# Patient Record
Sex: Female | Born: 1935 | Race: White | Hispanic: No | Smoking: Never smoker
Health system: Southern US, Community
[De-identification: ages and names within clinical notes are randomized; demographics above are authoritative.]

## PROBLEM LIST (undated history)

## (undated) DIAGNOSIS — M1990 Unspecified osteoarthritis, unspecified site: Secondary | ICD-10-CM

## (undated) DIAGNOSIS — E785 Hyperlipidemia, unspecified: Secondary | ICD-10-CM

## (undated) DIAGNOSIS — E039 Hypothyroidism, unspecified: Secondary | ICD-10-CM

## (undated) DIAGNOSIS — I739 Peripheral vascular disease, unspecified: Secondary | ICD-10-CM

## (undated) DIAGNOSIS — M810 Age-related osteoporosis without current pathological fracture: Secondary | ICD-10-CM

## (undated) DIAGNOSIS — M353 Polymyalgia rheumatica: Secondary | ICD-10-CM

## (undated) DIAGNOSIS — I10 Essential (primary) hypertension: Secondary | ICD-10-CM

## (undated) DIAGNOSIS — I701 Atherosclerosis of renal artery: Secondary | ICD-10-CM

## (undated) HISTORY — PX: JOINT REPLACEMENT: SHX530

## (undated) HISTORY — PX: TONSILLECTOMY: SUR1361

## (undated) HISTORY — PX: ELBOW SURGERY: SHX618

## (undated) HISTORY — PX: BREAST BIOPSY: SHX20

## (undated) HISTORY — PX: TEMPORAL ARTERY BIOPSY / LIGATION: SUR132

---

## 2010-06-24 ENCOUNTER — Ambulatory Visit: Payer: Self-pay

## 2010-09-15 ENCOUNTER — Ambulatory Visit: Payer: Self-pay | Admitting: Unknown Physician Specialty

## 2010-09-21 DIAGNOSIS — N289 Disorder of kidney and ureter, unspecified: Secondary | ICD-10-CM

## 2010-09-22 ENCOUNTER — Inpatient Hospital Stay: Payer: Self-pay | Admitting: Unknown Physician Specialty

## 2010-10-27 ENCOUNTER — Emergency Department: Payer: Self-pay | Admitting: Emergency Medicine

## 2010-11-16 ENCOUNTER — Ambulatory Visit: Payer: Self-pay | Admitting: Internal Medicine

## 2010-12-16 ENCOUNTER — Ambulatory Visit: Payer: Self-pay | Admitting: Internal Medicine

## 2011-07-05 ENCOUNTER — Ambulatory Visit: Payer: Self-pay | Admitting: Ophthalmology

## 2012-02-03 ENCOUNTER — Ambulatory Visit: Payer: Self-pay | Admitting: Physical Medicine and Rehabilitation

## 2012-06-19 ENCOUNTER — Encounter: Payer: Self-pay | Admitting: Cardiothoracic Surgery

## 2012-06-19 ENCOUNTER — Encounter: Payer: Self-pay | Admitting: Nurse Practitioner

## 2012-07-15 ENCOUNTER — Encounter: Payer: Self-pay | Admitting: Nurse Practitioner

## 2012-07-15 ENCOUNTER — Encounter: Payer: Self-pay | Admitting: Cardiothoracic Surgery

## 2012-08-15 ENCOUNTER — Encounter: Payer: Self-pay | Admitting: Nurse Practitioner

## 2012-08-15 ENCOUNTER — Encounter: Payer: Self-pay | Admitting: Cardiothoracic Surgery

## 2013-02-16 ENCOUNTER — Emergency Department: Payer: Self-pay | Admitting: Emergency Medicine

## 2013-02-17 ENCOUNTER — Emergency Department: Payer: Self-pay | Admitting: Emergency Medicine

## 2013-02-17 LAB — COMPREHENSIVE METABOLIC PANEL
Albumin: 3.3 g/dL — ABNORMAL LOW (ref 3.4–5.0)
Alkaline Phosphatase: 63 U/L (ref 50–136)
Bilirubin,Total: 0.7 mg/dL (ref 0.2–1.0)
Chloride: 106 mmol/L (ref 98–107)
Co2: 26 mmol/L (ref 21–32)
Creatinine: 1.42 mg/dL — ABNORMAL HIGH (ref 0.60–1.30)
EGFR (African American): 38 — ABNORMAL LOW
EGFR (Non-African Amer.): 33 — ABNORMAL LOW
Glucose: 79 mg/dL (ref 65–99)
Potassium: 3.9 mmol/L (ref 3.5–5.1)
SGOT(AST): 34 U/L (ref 15–37)
SGPT (ALT): 24 U/L (ref 12–78)
Sodium: 141 mmol/L (ref 136–145)
Total Protein: 6.7 g/dL (ref 6.4–8.2)

## 2013-02-17 LAB — URINALYSIS, COMPLETE
Bacteria: NONE SEEN
Bilirubin,UR: NEGATIVE
Leukocyte Esterase: NEGATIVE
Ph: 9 (ref 4.5–8.0)
Protein: NEGATIVE
RBC,UR: 1 /HPF (ref 0–5)
Squamous Epithelial: NONE SEEN
WBC UR: NONE SEEN /HPF (ref 0–5)

## 2013-02-17 LAB — CBC
HGB: 13 g/dL (ref 12.0–16.0)
MCH: 30.4 pg (ref 26.0–34.0)
MCV: 89 fL (ref 80–100)
RDW: 13.1 % (ref 11.5–14.5)
WBC: 14.3 10*3/uL — ABNORMAL HIGH (ref 3.6–11.0)

## 2013-02-19 ENCOUNTER — Ambulatory Visit: Payer: Self-pay | Admitting: Physical Medicine and Rehabilitation

## 2013-03-19 ENCOUNTER — Ambulatory Visit: Payer: Self-pay | Admitting: Rheumatology

## 2013-04-10 ENCOUNTER — Ambulatory Visit: Payer: Self-pay | Admitting: Pain Medicine

## 2013-04-30 ENCOUNTER — Emergency Department: Payer: Self-pay | Admitting: Emergency Medicine

## 2013-04-30 LAB — URINALYSIS, COMPLETE
Bacteria: NONE SEEN
Blood: NEGATIVE
Glucose,UR: NEGATIVE mg/dL (ref 0–75)
Ketone: NEGATIVE
Leukocyte Esterase: NEGATIVE
Nitrite: NEGATIVE
Ph: 6 (ref 4.5–8.0)
RBC,UR: NONE SEEN /HPF (ref 0–5)
Squamous Epithelial: <1
WBC UR: 1 /HPF (ref 0–5)

## 2013-04-30 LAB — CBC
HCT: 37.5 % (ref 35.0–47.0)
HGB: 11.8 g/dL — ABNORMAL LOW (ref 12.0–16.0)
MCH: 27.3 pg (ref 26.0–34.0)
MCHC: 31.4 g/dL — ABNORMAL LOW (ref 32.0–36.0)
MCV: 87 fL (ref 80–100)
Platelet: 375 10*3/uL (ref 150–440)
RBC: 4.31 10*6/uL (ref 3.80–5.20)
RDW: 13.5 % (ref 11.5–14.5)
WBC: 15.2 10*3/uL — ABNORMAL HIGH (ref 3.6–11.0)

## 2013-04-30 LAB — BASIC METABOLIC PANEL
Anion Gap: 3 — ABNORMAL LOW (ref 7–16)
BUN: 15 mg/dL (ref 7–18)
Chloride: 102 mmol/L (ref 98–107)
Co2: 30 mmol/L (ref 21–32)
EGFR (African American): 51 — ABNORMAL LOW
EGFR (Non-African Amer.): 44 — ABNORMAL LOW
Sodium: 135 mmol/L — ABNORMAL LOW (ref 136–145)

## 2013-06-28 ENCOUNTER — Ambulatory Visit: Payer: Self-pay | Admitting: Pain Medicine

## 2014-05-27 ENCOUNTER — Emergency Department: Payer: Self-pay | Admitting: Emergency Medicine

## 2014-05-27 LAB — URINALYSIS, COMPLETE
BLOOD: NEGATIVE
Bilirubin,UR: NEGATIVE
Glucose,UR: NEGATIVE mg/dL (ref 0–75)
Hyaline Cast: 2
Ketone: NEGATIVE
Leukocyte Esterase: NEGATIVE
NITRITE: NEGATIVE
PROTEIN: NEGATIVE
Ph: 5 (ref 4.5–8.0)
RBC,UR: 1 /HPF (ref 0–5)
Specific Gravity: 1.013 (ref 1.003–1.030)
Squamous Epithelial: <1

## 2014-05-27 LAB — BASIC METABOLIC PANEL
Anion Gap: 8 (ref 7–16)
BUN: 24 mg/dL — ABNORMAL HIGH (ref 7–18)
CO2: 24 mmol/L (ref 21–32)
CREATININE: 1.2 mg/dL (ref 0.60–1.30)
Calcium, Total: 8.9 mg/dL (ref 8.5–10.1)
Chloride: 106 mmol/L (ref 98–107)
GFR CALC AF AMER: 54 — AB
GFR CALC NON AF AMER: 45 — AB
Glucose: 136 mg/dL — ABNORMAL HIGH (ref 65–99)
Osmolality: 282 (ref 275–301)
Potassium: 4.1 mmol/L (ref 3.5–5.1)
Sodium: 138 mmol/L (ref 136–145)

## 2014-05-27 LAB — CBC WITH DIFFERENTIAL/PLATELET
BASOS ABS: 0.1 10*3/uL (ref 0.0–0.1)
Basophil %: 1.2 %
Eosinophil #: 0 10*3/uL (ref 0.0–0.7)
Eosinophil %: 0.3 %
HCT: 28.8 % — ABNORMAL LOW (ref 35.0–47.0)
HGB: 8.8 g/dL — ABNORMAL LOW (ref 12.0–16.0)
LYMPHS ABS: 0.9 10*3/uL — AB (ref 1.0–3.6)
Lymphocyte %: 10.1 %
MCH: 25.5 pg — AB (ref 26.0–34.0)
MCHC: 30.5 g/dL — ABNORMAL LOW (ref 32.0–36.0)
MCV: 84 fL (ref 80–100)
MONO ABS: 0.6 x10 3/mm (ref 0.2–0.9)
Monocyte %: 7.4 %
NEUTROS PCT: 81 %
Neutrophil #: 7.1 10*3/uL — ABNORMAL HIGH (ref 1.4–6.5)
PLATELETS: 489 10*3/uL — AB (ref 150–440)
RBC: 3.44 10*6/uL — ABNORMAL LOW (ref 3.80–5.20)
RDW: 17.6 % — ABNORMAL HIGH (ref 11.5–14.5)
WBC: 8.7 10*3/uL (ref 3.6–11.0)

## 2014-05-27 LAB — TROPONIN I: Troponin-I: <0.02 ng/mL

## 2015-01-19 ENCOUNTER — Encounter: Payer: Self-pay | Admitting: Adult Health

## 2015-01-19 ENCOUNTER — Inpatient Hospital Stay
Admission: EM | Admit: 2015-01-19 | Discharge: 2015-01-22 | DRG: 392 | Disposition: A | Discharge status: Home / Self Care | Payer: Medicare Other | Attending: Internal Medicine | Admitting: Internal Medicine

## 2015-01-19 ENCOUNTER — Emergency Department: Payer: Medicare Other

## 2015-01-19 DIAGNOSIS — I739 Peripheral vascular disease, unspecified: Secondary | ICD-10-CM | POA: Diagnosis present

## 2015-01-19 DIAGNOSIS — M810 Age-related osteoporosis without current pathological fracture: Secondary | ICD-10-CM | POA: Diagnosis present

## 2015-01-19 DIAGNOSIS — K862 Cyst of pancreas: Secondary | ICD-10-CM | POA: Diagnosis present

## 2015-01-19 DIAGNOSIS — R1031 Right lower quadrant pain: Secondary | ICD-10-CM

## 2015-01-19 DIAGNOSIS — M353 Polymyalgia rheumatica: Secondary | ICD-10-CM | POA: Diagnosis not present

## 2015-01-19 DIAGNOSIS — E039 Hypothyroidism, unspecified: Secondary | ICD-10-CM | POA: Diagnosis present

## 2015-01-19 DIAGNOSIS — E860 Dehydration: Secondary | ICD-10-CM | POA: Diagnosis not present

## 2015-01-19 DIAGNOSIS — Z7983 Long term (current) use of bisphosphonates: Secondary | ICD-10-CM

## 2015-01-19 DIAGNOSIS — E785 Hyperlipidemia, unspecified: Secondary | ICD-10-CM | POA: Diagnosis not present

## 2015-01-19 DIAGNOSIS — Z7951 Long term (current) use of inhaled steroids: Secondary | ICD-10-CM

## 2015-01-19 DIAGNOSIS — K5732 Diverticulitis of large intestine without perforation or abscess without bleeding: Principal | ICD-10-CM | POA: Diagnosis present

## 2015-01-19 DIAGNOSIS — Z823 Family history of stroke: Secondary | ICD-10-CM | POA: Diagnosis not present

## 2015-01-19 DIAGNOSIS — M1990 Unspecified osteoarthritis, unspecified site: Secondary | ICD-10-CM | POA: Diagnosis present

## 2015-01-19 DIAGNOSIS — I10 Essential (primary) hypertension: Secondary | ICD-10-CM | POA: Diagnosis present

## 2015-01-19 DIAGNOSIS — Z825 Family history of asthma and other chronic lower respiratory diseases: Secondary | ICD-10-CM

## 2015-01-19 DIAGNOSIS — Z8249 Family history of ischemic heart disease and other diseases of the circulatory system: Secondary | ICD-10-CM | POA: Diagnosis not present

## 2015-01-19 DIAGNOSIS — I701 Atherosclerosis of renal artery: Secondary | ICD-10-CM | POA: Diagnosis present

## 2015-01-19 DIAGNOSIS — Z79899 Other long term (current) drug therapy: Secondary | ICD-10-CM | POA: Diagnosis not present

## 2015-01-19 HISTORY — DX: Peripheral vascular disease, unspecified: I73.9

## 2015-01-19 HISTORY — DX: Unspecified osteoarthritis, unspecified site: M19.90

## 2015-01-19 HISTORY — DX: Hypothyroidism, unspecified: E03.9

## 2015-01-19 HISTORY — DX: Age-related osteoporosis without current pathological fracture: M81.0

## 2015-01-19 HISTORY — DX: Essential (primary) hypertension: I10

## 2015-01-19 HISTORY — DX: Polymyalgia rheumatica: M35.3

## 2015-01-19 HISTORY — DX: Hyperlipidemia, unspecified: E78.5

## 2015-01-19 HISTORY — DX: Atherosclerosis of renal artery: I70.1

## 2015-01-19 LAB — COMPREHENSIVE METABOLIC PANEL
ALBUMIN: 3.9 g/dL (ref 3.5–5.0)
ALK PHOS: 43 U/L (ref 38–126)
ALT: 14 U/L (ref 14–54)
AST: 33 U/L (ref 15–41)
Anion gap: 11 (ref 5–15)
BILIRUBIN TOTAL: 0.8 mg/dL (ref 0.3–1.2)
BUN: 29 mg/dL — AB (ref 6–20)
CO2: 22 mmol/L (ref 22–32)
Calcium: 9.1 mg/dL (ref 8.9–10.3)
Chloride: 101 mmol/L (ref 101–111)
Creatinine, Ser: 1.04 mg/dL — ABNORMAL HIGH (ref 0.44–1.00)
GFR calc Af Amer: 53 mL/min — ABNORMAL LOW (ref >60)
GFR calc non Af Amer: 46 mL/min — ABNORMAL LOW (ref >60)
GLUCOSE: 104 mg/dL — AB (ref 65–99)
POTASSIUM: 4.6 mmol/L (ref 3.5–5.1)
Sodium: 134 mmol/L — ABNORMAL LOW (ref 135–145)
TOTAL PROTEIN: 6.6 g/dL (ref 6.5–8.1)

## 2015-01-19 LAB — CBC
HEMATOCRIT: 31 % — AB (ref 35.0–47.0)
Hemoglobin: 9.8 g/dL — ABNORMAL LOW (ref 12.0–16.0)
MCH: 26.8 pg (ref 26.0–34.0)
MCHC: 31.4 g/dL — AB (ref 32.0–36.0)
MCV: 85.4 fL (ref 80.0–100.0)
Platelets: 444 10*3/uL — ABNORMAL HIGH (ref 150–440)
RBC: 3.63 MIL/uL — ABNORMAL LOW (ref 3.80–5.20)
RDW: 19.7 % — AB (ref 11.5–14.5)
WBC: 21.5 10*3/uL — ABNORMAL HIGH (ref 3.6–11.0)

## 2015-01-19 LAB — URINALYSIS COMPLETE WITH MICROSCOPIC (ARMC ONLY)
BACTERIA UA: NONE SEEN
BILIRUBIN URINE: NEGATIVE
GLUCOSE, UA: NEGATIVE mg/dL
HGB URINE DIPSTICK: NEGATIVE
Leukocytes, UA: NEGATIVE
NITRITE: NEGATIVE
Protein, ur: 30 mg/dL — AB
SQUAMOUS EPITHELIAL / LPF: NONE SEEN
Specific Gravity, Urine: 1.011 (ref 1.005–1.030)
pH: 7 (ref 5.0–8.0)

## 2015-01-19 LAB — TROPONIN I: Troponin I: <0.03 ng/mL (ref <0.031)

## 2015-01-19 LAB — LIPASE, BLOOD: LIPASE: 42 U/L (ref 22–51)

## 2015-01-19 MED ORDER — PIPERACILLIN-TAZOBACTAM 3.375 G IVPB
3.3750 g | Freq: Once | INTRAVENOUS | Status: AC
  Administered 2015-01-19: 3.375 g via INTRAVENOUS
  Filled 2015-01-19: qty 50

## 2015-01-19 MED ORDER — IOHEXOL 300 MG/ML  SOLN
100.0000 mL | Freq: Once | INTRAMUSCULAR | Status: AC | PRN
  Administered 2015-01-19: 80 mL via INTRAVENOUS

## 2015-01-19 MED ORDER — IOHEXOL 240 MG/ML SOLN: 25.0000 mL | INTRAMUSCULAR | Status: AC

## 2015-01-19 NOTE — ED Notes (Signed)
Presents from home-lives alone-c/o nausea, abdominal pain and nausea and right flank pain-endorses constipation, last BM is unknown. Endorses frequent urination. Given 4 mg zofran by EMS with relief of nausea-pt has tenderness to right flank and side with touch-skin very warm to touch-oral temp 98.3.

## 2015-01-19 NOTE — Consult Note (Signed)
Kristen Church is a 81 y.o. female  seen in the emergency room with abdominal pain.  HPI: She presents emergency room with approximately 10 hours of abdominal pain. She's felt "bad" all day and began to develop some nausea this afternoon. She complained of lower quadrant abdominal pain primarily on the right side. She felt that if she would vomit her symptoms would improve but she could not make herself vomit. She denies any fever. She is a bit confused for pain medicine so keeping her on track is difficult.  Looking at her hospital chart she does not have any previous similar problems. She was diagnosed in 2012 with ischemic colitis and mesenteric ischemia primarily on her ultrasound and colonoscopy. She's not had a colonoscopy since that time. She does not have any significant bowel problems other than mild constipation but again I'm not certain the patient's history is accurate.  Workup in the emergency room revealed an elevated white blood cell count at 21,000. 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 She does have history of chronic abdominal pain and chronic pain syndrome. She also has some thickened colon some mesenteric edema consistent with possible colitis possible diverticulitis. Does not appear to be any free air or free fluid. Surgical service was consulted.  Past Medical History  Diagnosis Date  . Hypertension    History reviewed. No pertinent past surgical history. Social History   Social History  . Marital Status: Widowed    Spouse Name: N/A  . Number of Children: N/A  . Years of Education: N/A   Social History Main Topics  . Smoking status: Never Smoker   . Smokeless tobacco: None  . Alcohol Use: No  . Drug Use: No  . Sexual Activity: Not Asked   Other Topics Concern  . None   Social History Narrative  . None    Review of Systems: Review of Systems  Unable to perform ROS: acuity of condition   patient is moderately confused but  has been medicated.  PHYSICAL EXAM: BP 164/81 mmHg  Pulse 89  Temp(Src) 99.9 F (37.7 C) (Rectal)  Resp 22  Ht 4\' 9"  (1.448 m)  Wt 97 lb 8 oz (44.226 kg)  BMI 21.09 kg/m2  SpO2 99%  LMP  (Approximate)  Physical Exam  Constitutional:  She appears cachectic and quite undernourished.  HENT:  Head: Normocephalic and atraumatic.  Eyes: EOM are normal. Pupils are equal, round, and reactive to light.  Neck: Normal range of motion. Neck supple.  Cardiovascular: Regular rhythm, normal heart sounds and intact distal pulses.   No murmur heard. Pulmonary/Chest: Effort normal and breath sounds normal. No respiratory distress. She has no wheezes.  Abdominal: Soft. Bowel sounds are normal. There is tenderness. There is guarding.  Musculoskeletal: Normal range of motion. She exhibits edema.  Neurological:  She is not completely oriented. She does not know what day it is. She does not she is at the hospital. She is arousable but lethargic.  Skin: Skin is warm and dry.  Her skin is quite thin with easy bruisability.  Psychiatric:  Unable to determine.   Her abdomen is soft not distended with some generalized right lower quadrant suprapubic tenderness. She does have active bowel sounds. She has some guarding but no rebound.  Impression/Plan: I independently reviewed her CT scan. This very difficult study with her hip replacements which cloud the pelvis with artifact. She does have some colonic swelling and by report some mesenteric edema.  No  free air or free fluid. There is no obvious surgical indication. With her history of mesenteric ischemia aspect she may have the same problem although at this point she may have some colonic inertia providing colonic distention and ischemia. We will continue to follow her while hospitalized. She is a very poor surgical candidate. No family is available to discuss aggressiveness of care with at the present time.   Dia Crawford III, MD  01/19/2015, 11:08 PM

## 2015-01-19 NOTE — ED Provider Notes (Signed)
[facility] Provider Note  ____________________________________________  Time seen: Approximately 8:28 PM  I have reviewed the triage vital signs and the nursing notes.   HISTORY  Chief Complaint Nausea and Abdominal Pain   HPI Kristen Church is a 81 y.o. female who reports she had a small stool this today in her apartment and then she got nauseated and was walking around trying to throw up but couldn't. Since then she has developed right lower quadrant abdominal pain and tenderness. He does not have any fever is still somewhat nauseated nothing seems to make the pain better or worse pain did seem to move from generalized to her Right Lower Quadrant.   Past Medical History  Diagnosis Date  . Hypertension     There are no active problems to display for this patient.   History reviewed. No pertinent past surgical history.  No current outpatient prescriptions on file.  Allergies Sulfa antibiotics  History reviewed. No pertinent family history.  Social History Social History  Substance Use Topics  . Smoking status: Never Smoker   . Smokeless tobacco: None  . Alcohol Use: No    Review of Systems Constitutional: No fever/chills Eyes: No visual changes. ENT: No sore throat. Cardiovascular: Denies chest pain. Respiratory: Denies shortness of breath. Gastrointestinal: See history of present illness Genitourinary: Negative for dysuria. Musculoskeletal: Negative for back pain. Skin: Negative for rash. Neurological: Negative for headaches, focal weakness or numbness.  10-point ROS otherwise negative.  ____________________________________________   PHYSICAL EXAM:  VITAL SIGNS: ED Triage Vitals  Enc Vitals Group     BP 01/19/15 1855 192/83 mmHg     Pulse Rate 01/19/15 1855 81     Resp 01/19/15 1855 20     Temp 01/19/15 1855 98.3 F (36.8 C)     Temp Source 01/19/15 1855 Oral     SpO2 01/19/15 1855 100 %     Weight  01/19/15 1855 97 lb 8 oz (44.226 kg)     Height 01/19/15 1855 4\' 9"  (1.448 m)     Head Cir --      Peak Flow --      Pain Score 01/19/15 1856 10     Pain Loc --      Pain Edu? --      Excl. in [place]? --     Constitutional: Alert and oriented. Well appearing and in no acute distress. Eyes: Conjunctivae are normal. PERRL. EOMI. Head: Atraumatic. Nose: No congestion/rhinnorhea. Mouth/Throat: Mucous membranes are moist.  Oropharynx non-erythematous. Neck: No stridor. Cardiovascular: Normal rate, regular rhythm. Grossly normal heart sounds.  Good peripheral circulation. Respiratory: Normal respiratory effort.  No retractions. Lungs CTAB. Gastrointestinal: Soft and nontender except for right lower quadrant and right lower quadrant is tender to palpation and percussion No distention. No abdominal bruits. No CVA tenderness. Musculoskeletal: No lower extremity tenderness nor edema.  No joint effusions. Neurologic:  Normal speech and language. No gross focal neurologic deficits are appreciated. No gait instability. Skin:  Skin is warm, dry and intact. No rash noted. Psychiatric: Mood and affect are normal. Speech and behavior are normal.  ____________________________________________   LABS (all labs ordered are listed, but only abnormal results are displayed)  Labs Reviewed  COMPREHENSIVE METABOLIC PANEL - Abnormal; Notable for the following:    Sodium 134 (*)    Glucose, Bld 104 (*)    BUN 29 (*)    Creatinine, Ser 1.04 (*)    GFR calc non Af Amer 46 (*)  GFR calc Af Amer 53 (*)    All other components within normal limits  CBC - Abnormal; Notable for the following:    WBC 21.5 (*)    RBC 3.63 (*)    Hemoglobin 9.8 (*)    HCT 31.0 (*)    MCHC 31.4 (*)    RDW 19.7 (*)    Platelets 444 (*)    All other components within normal limits  URINALYSIS COMPLETEWITH MICROSCOPIC ([facility] ONLY) - Abnormal; Notable for the following:    Color, Urine YELLOW (*)    APPearance CLEAR (*)     Ketones, ur 1+ (*)    Protein, ur 30 (*)    All other components within normal limits  LIPASE, BLOOD  TROPONIN I   ____________________________________________  EKG  EKG read and interpreted by me shows normal sinus rhythm rate of 91 left axis no acute changes ____________________________________________  RADIOLOGY  Radiologist reads the CT scan as possible diverticulitis and the abnormalities in the pancreas. ____________________________________________   PROCEDURES   ____________________________________________   INITIAL IMPRESSION / ASSESSMENT AND PLAN / ED COURSE  Pertinent labs & imaging results that were available during my care of the patient were reviewed by me and considered in my medical decision making (see chart for details). Patient continues to have right lower quadrant pain a very high white blood count. I discussed the patient with Dr. Pat Patrick and the medical hospitalist as well. Dr. Pat Patrick will consult on the patient and the have ordered some antibiotics for the patient medical hospitalist will admit the patient since he is back down from seen somebody on the floor.  ____________________________________________   FINAL CLINICAL IMPRESSION(S) / ED DIAGNOSES  Final diagnoses:  Right lower quadrant abdominal pain      Nena Polio, MD 01/19/15 2234

## 2015-01-20 DIAGNOSIS — E860 Dehydration: Secondary | ICD-10-CM | POA: Diagnosis present

## 2015-01-20 DIAGNOSIS — K5732 Diverticulitis of large intestine without perforation or abscess without bleeding: Principal | ICD-10-CM

## 2015-01-20 LAB — BASIC METABOLIC PANEL
ANION GAP: 7 (ref 5–15)
BUN: 24 mg/dL — ABNORMAL HIGH (ref 6–20)
CO2: 25 mmol/L (ref 22–32)
Calcium: 8 mg/dL — ABNORMAL LOW (ref 8.9–10.3)
Chloride: 102 mmol/L (ref 101–111)
Creatinine, Ser: 1.14 mg/dL — ABNORMAL HIGH (ref 0.44–1.00)
GFR calc Af Amer: 48 mL/min — ABNORMAL LOW (ref >60)
GFR, EST NON AFRICAN AMERICAN: 41 mL/min — AB (ref >60)
Glucose, Bld: 90 mg/dL (ref 65–99)
POTASSIUM: 3.7 mmol/L (ref 3.5–5.1)
SODIUM: 134 mmol/L — AB (ref 135–145)

## 2015-01-20 LAB — CBC
HEMATOCRIT: 26.2 % — AB (ref 35.0–47.0)
HEMOGLOBIN: 8.5 g/dL — AB (ref 12.0–16.0)
MCH: 27.9 pg (ref 26.0–34.0)
MCHC: 32.7 g/dL (ref 32.0–36.0)
MCV: 85.5 fL (ref 80.0–100.0)
Platelets: 355 10*3/uL (ref 150–440)
RBC: 3.06 MIL/uL — ABNORMAL LOW (ref 3.80–5.20)
RDW: 20 % — ABNORMAL HIGH (ref 11.5–14.5)
WBC: 23.6 10*3/uL — AB (ref 3.6–11.0)

## 2015-01-20 MED ORDER — PIPERACILLIN-TAZOBACTAM 3.375 G IVPB
3.3750 g | Freq: Three times a day (TID) | INTRAVENOUS | Status: DC
  Administered 2015-01-20 – 2015-01-22 (×7): 3.375 g via INTRAVENOUS
  Filled 2015-01-20 (×11): qty 50

## 2015-01-20 MED ORDER — ONDANSETRON HCL 4 MG PO TABS: 4.0000 mg | ORAL_TABLET | Freq: Four times a day (QID) | ORAL | Status: DC | PRN

## 2015-01-20 MED ORDER — TRAMADOL HCL 50 MG PO TABS: 100.0000 mg | ORAL_TABLET | Freq: Two times a day (BID) | ORAL | Status: DC | PRN

## 2015-01-20 MED ORDER — AMLODIPINE BESY-BENAZEPRIL HCL 5-20 MG PO CAPS: 1.0000 | ORAL_CAPSULE | Freq: Every day | ORAL | Status: DC

## 2015-01-20 MED ORDER — METHOTREXATE 2.5 MG PO TABS: 15.0000 mg | ORAL_TABLET | ORAL | Status: DC

## 2015-01-20 MED ORDER — ENOXAPARIN SODIUM 30 MG/0.3ML ~~LOC~~ SOLN
30.0000 mg | SUBCUTANEOUS | Status: DC
  Administered 2015-01-20 – 2015-01-22 (×3): 30 mg via SUBCUTANEOUS
  Filled 2015-01-20 (×3): qty 0.3

## 2015-01-20 MED ORDER — BOOST / RESOURCE BREEZE PO LIQD
1.0000 | Freq: Three times a day (TID) | ORAL | Status: DC
  Administered 2015-01-20 – 2015-01-22 (×6): 1 via ORAL

## 2015-01-20 MED ORDER — ACETAMINOPHEN 650 MG RE SUPP: 650.0000 mg | Freq: Four times a day (QID) | RECTAL | Status: DC | PRN

## 2015-01-20 MED ORDER — FOLIC ACID 1 MG PO TABS
1.0000 mg | ORAL_TABLET | Freq: Every day | ORAL | Status: DC
  Administered 2015-01-20 – 2015-01-22 (×3): 1 mg via ORAL
  Filled 2015-01-20 (×3): qty 1

## 2015-01-20 MED ORDER — BENAZEPRIL HCL 20 MG PO TABS
20.0000 mg | ORAL_TABLET | Freq: Every day | ORAL | Status: DC
  Administered 2015-01-20 – 2015-01-22 (×3): 20 mg via ORAL
  Filled 2015-01-20 (×3): qty 1

## 2015-01-20 MED ORDER — LEVOTHYROXINE SODIUM 75 MCG PO TABS
75.0000 ug | ORAL_TABLET | Freq: Every day | ORAL | Status: DC
Start: 2015-01-20 — End: 2015-01-22
  Administered 2015-01-20 – 2015-01-22 (×3): 75 ug via ORAL
  Filled 2015-01-20 (×3): qty 1

## 2015-01-20 MED ORDER — SODIUM CHLORIDE 0.9 % IV SOLN
INTRAVENOUS | Status: AC
  Administered 2015-01-20: 13:00:00 via INTRAVENOUS

## 2015-01-20 MED ORDER — FAMOTIDINE 20 MG PO TABS
20.0000 mg | ORAL_TABLET | Freq: Every day | ORAL | Status: DC
  Administered 2015-01-20 – 2015-01-22 (×3): 20 mg via ORAL
  Filled 2015-01-20 (×3): qty 1

## 2015-01-20 MED ORDER — ONDANSETRON HCL 4 MG/2ML IJ SOLN: 4.0000 mg | Freq: Four times a day (QID) | INTRAMUSCULAR | Status: DC | PRN

## 2015-01-20 MED ORDER — HYDROXYCHLOROQUINE SULFATE 200 MG PO TABS
200.0000 mg | ORAL_TABLET | Freq: Every day | ORAL | Status: DC
  Administered 2015-01-21 – 2015-01-22 (×2): 200 mg via ORAL
  Filled 2015-01-20 (×2): qty 1

## 2015-01-20 MED ORDER — ACETAMINOPHEN 325 MG PO TABS: 650.0000 mg | ORAL_TABLET | Freq: Four times a day (QID) | ORAL | Status: DC | PRN

## 2015-01-20 MED ORDER — INFLUENZA VAC SPLIT QUAD 0.5 ML IM SUSY
0.5000 mL | PREFILLED_SYRINGE | INTRAMUSCULAR | Status: AC | PRN
  Administered 2015-01-22: 0.5 mL via INTRAMUSCULAR
  Filled 2015-01-20: qty 0.5

## 2015-01-20 MED ORDER — AMLODIPINE BESYLATE 5 MG PO TABS
5.0000 mg | ORAL_TABLET | Freq: Every day | ORAL | Status: DC
  Administered 2015-01-20 – 2015-01-22 (×3): 5 mg via ORAL
  Filled 2015-01-20 (×3): qty 1

## 2015-01-20 MED ORDER — NEBIVOLOL HCL 10 MG PO TABS
10.0000 mg | ORAL_TABLET | Freq: Every day | ORAL | Status: DC
  Administered 2015-01-22: 10 mg via ORAL
  Filled 2015-01-20 (×3): qty 1

## 2015-01-20 MED ORDER — HYDROXYCHLOROQUINE SULFATE 200 MG PO TABS: 200.0000 mg | ORAL_TABLET | Freq: Every day | ORAL | Status: DC

## 2015-01-20 MED ORDER — PREDNISONE 2.5 MG PO TABS
2.5000 mg | ORAL_TABLET | Freq: Every day | ORAL | Status: DC
  Administered 2015-01-20 – 2015-01-22 (×3): 2.5 mg via ORAL
  Filled 2015-01-20 (×3): qty 1

## 2015-01-20 NOTE — Progress Notes (Signed)
Patient ID: Kristen Church, female   DOB: 1923-04-16, 81 y.o.   MRN: [id number]   [facility]   PATIENT NAME: Kristen Church    MR#:  [id number]  DATE OF BIRTH:  01-04-1923  SUBJECTIVE:  She is feeling better, denies abdominal pain, no nausea or emesis since admission  REVIEW OF SYSTEMS:   Review of Systems  Constitutional: Negative for fever and chills.  Respiratory: Negative for cough.   Cardiovascular: Negative for chest pain and palpitations.  Gastrointestinal: Positive for nausea, vomiting and abdominal pain.    DRUG ALLERGIES:   Allergies  Allergen Reactions  . Celebrex [Celecoxib]     confusion  . Macrobid [Nitrofurantoin Monohyd Macro]     Stiffness, redness, hot feeling  . Neurontin [Gabapentin]     dizziness  . Sulfa Antibiotics     VITALS:  Blood pressure 136/55, pulse 79, temperature 97.7 F (36.5 C), temperature source Oral, resp. rate 17, height 4\' 9"  (1.448 m), weight 97 lb 8 oz (44.226 kg), SpO2 100 %.  Physical Exam  Constitutional: She is oriented to person, place, and time.  HENT:  Head: Normocephalic and atraumatic.  Cardiovascular: Normal rate.   Pulmonary/Chest: Effort normal.  Abdominal: Soft. She exhibits no distension. There is no tenderness.  Neurological: She is oriented to person, place, and time.  Skin: Skin is warm and dry. She is not diaphoretic.   PHYSICAL EXAMINATION:   I personally reviewed the CT scan images. I discussed the case with Dr. Pat Patrick.   ASSESSMENT AND PLAN:   81 year old with ischemic versus sigmoid diverticulitis.  At present I see no indication for surgical intervention. I would continue her empiric intravenous antibiotics hydration and allow for clear liquid diet. Orders were written. I'll follow with you.

## 2015-01-20 NOTE — Progress Notes (Signed)
ANTIBIOTIC CONSULT NOTE - INITIAL  Pharmacy Consult for Zosyn Indication: IAI  Allergies  Allergen Reactions  . Celebrex [Celecoxib]     confusion  . Macrobid [Nitrofurantoin Monohyd Macro]     Stiffness, redness, hot feeling  . Neurontin [Gabapentin]     dizziness  . Sulfa Antibiotics     Patient Measurements: Height: 4\' 9"  (144.8 cm) Weight: 97 lb 8 oz (44.226 kg) IBW/kg (Calculated) : 38.6 Adjusted Body Weight:   Vital Signs: Temp: 100.4 F (38 C) (09/05 0224) Temp Source: Oral (09/05 0224) BP: 152/50 mmHg (09/05 0130) Pulse Rate: 82 (09/05 0224) Intake/Output from previous day:   Intake/Output from this shift:    Labs:  Recent Labs  01/19/15 1909  WBC 21.5*  HGB 9.8*  PLT 444*  CREATININE 1.04*   Estimated Creatinine Clearance: 21.9 mL/min (by C-G formula based on Cr of 1.04). No results for input(s): VANCOTROUGH, VANCOPEAK, VANCORANDOM, GENTTROUGH, GENTPEAK, GENTRANDOM, TOBRATROUGH, TOBRAPEAK, TOBRARND, AMIKACINPEAK, AMIKACINTROU, AMIKACIN in the last 72 hours.   Microbiology: No results found for this or any previous visit (from the past 720 hour(s)).  Medical History: Past Medical History  Diagnosis Date  . Hypertension   . PMR (polymyalgia rheumatica)   . PVD (peripheral vascular disease)   . Renal artery stenosis   . Osteoarthritis   . HLD (hyperlipidemia)   . Hypothyroidism   . Osteoporosis, post-menopausal     Medications:  Infusions:  . sodium chloride     Assessment: 89 yof cc nausea/abdominal pain x 1 day with elevated WBC and CT evidence of likely sigmoid diverticulitis starting on Zosyn for IAI.  Goal of Therapy:  Resolve infection  Plan:  Expected duration 7 days with resolution of temperature and/or normalization of WBC. Zosyn 3.375 gm IV Q8H EI (CrCl currently > 20 mL/min, pharmacy will continue to follow and adjust if needed).   Laural Benes, Pharm.D.  Clinical Pharmacist 01/20/2015,2:43 AM

## 2015-01-20 NOTE — ED Notes (Addendum)
Attempted report, Rn stated to call back in 10-15mins, they have unstable pt they are trying to get to the unit.

## 2015-01-20 NOTE — ED Notes (Signed)
Report called [facility] . Pt going to [phone]

## 2015-01-20 NOTE — H&P (Signed)
[place] [facility] [place] NAME: Kristen Church    MR#:  [id number]  DATE OF BIRTH:  1922-12-05  DATE OF ADMISSION:  01/19/2015  PRIMARY CARE PHYSICIAN: BABAOFF, Caryl Bis, MD   REQUESTING/REFERRING PHYSICIAN: Cinda Quest, MD  CHIEF COMPLAINT:   Chief Complaint  Patient presents with  . Nausea  . Abdominal Pain    HISTORY OF PRESENT ILLNESS:  Kristen Church  is a 81 y.o. female who presents with lower quadrant abdominal pain and nausea 1 day. Patient is a poor historian and is unable to give a complete history of events to this interviewer. History is taken from patient, but also from ED physician. Patient does not have family present or available to speak with at this time. Patient denies any overt vomiting or diarrhea. In the ED she was found to have elevated white blood cell count around 20,000, and CT abdomen and pelvis showed likely sigmoid diverticulitis. It also showed some cysts in her pancreas which are likely to need further follow-up with MRI at some point in the near future. Hospitalists were called for admission for sigmoid diverticulitis.  PAST MEDICAL HISTORY:   Past Medical History  Diagnosis Date  . Hypertension   . PMR (polymyalgia rheumatica)   . PVD (peripheral vascular disease)   . Renal artery stenosis   . Osteoarthritis   . HLD (hyperlipidemia)   . Hypothyroidism   . Osteoporosis, post-menopausal     PAST SURGICAL HISTORY:   Past Surgical History  Procedure Laterality Date  . Tonsillectomy    . Elbow surgery Left   . Breast biopsy Right     benign  . Joint replacement Right     1995  . Joint replacement Left     2005  . Temporal artery biopsy / ligation      SOCIAL HISTORY:   Social History  Substance Use Topics  . Smoking status: Never Smoker   . Smokeless tobacco: Not on file  . Alcohol Use: No    FAMILY HISTORY:   Family History  Problem Relation Age of Onset  . CAD Mother   . Stroke Mother    . Peripheral vascular disease Father   . Ulcers Father   . Emphysema Father     DRUG ALLERGIES:   Allergies  Allergen Reactions  . Celebrex [Celecoxib]     confusion  . Macrobid [Nitrofurantoin Monohyd Macro]     Stiffness, redness, hot feeling  . Neurontin [Gabapentin]     dizziness  . Sulfa Antibiotics     MEDICATIONS AT HOME:   Prior to Admission medications   Medication Sig Start Date End Date Taking? Authorizing Provider  ranitidine (ZANTAC) 150 MG tablet Take 150 mg by mouth at bedtime.  10/07/14 10/07/15 Yes Historical Provider, MD  alendronate (FOSAMAX) 70 MG tablet Take 70 mg by mouth once a week.  01/08/15   Historical Provider, MD  amLODipine-benazepril (LOTREL) 5-20 MG per capsule Take 1 capsule by mouth daily. 01/13/15   Historical Provider, MD  BYSTOLIC 10 MG tablet Take 10 mg by mouth daily.  12/23/14   Historical Provider, MD  folic acid (FOLVITE) 1 MG tablet Take 1 mg by mouth daily.  01/06/15   Historical Provider, MD  hydroxychloroquine (PLAQUENIL) 200 MG tablet Take 200 mg by mouth daily.  11/28/14   Historical Provider, MD  levothyroxine (SYNTHROID, LEVOTHROID) 75 MCG tablet Take 75 mcg by mouth daily before breakfast.  12/31/14   Historical Provider, MD  lidocaine (LIDODERM) 5 % Place 1 patch onto the skin daily.  12/24/14   Historical Provider, MD  methotrexate (RHEUMATREX) 2.5 MG tablet Take 15 mg by mouth once a week.  12/25/14   Historical Provider, MD  predniSONE (DELTASONE) 5 MG tablet Take 2.5 mg by mouth daily. 12/30/14   Historical Provider, MD  traMADol (ULTRAM) 50 MG tablet Take 2 tablets by mouth every 6 (six) hours. 01/16/15   Historical Provider, MD    REVIEW OF SYSTEMS:  Review of Systems  Constitutional: Negative for fever, chills, weight loss and malaise/fatigue.  HENT: Negative for ear pain, hearing loss and tinnitus.   Eyes: Negative for blurred vision, double vision, pain and redness.  Respiratory: Negative for cough, hemoptysis and shortness of  breath.   Cardiovascular: Negative for chest pain, palpitations, orthopnea and leg swelling.  Gastrointestinal: Positive for nausea and abdominal pain. Negative for vomiting, diarrhea and constipation.  Genitourinary: Negative for dysuria, frequency and hematuria.  Musculoskeletal: Negative for back pain, joint pain and neck pain.  Skin:       No acne, rash, or lesions  Neurological: Negative for dizziness, tremors, focal weakness and weakness.  Endo/Heme/Allergies: Negative for polydipsia. Does not bruise/bleed easily.  Psychiatric/Behavioral: Negative for depression. The patient is not nervous/anxious and does not have insomnia.     Review of systems is reliable for pertinent positives, but less so for pertinent negatives as the patient is a poor historian and unable to completely answer all questions review of systems accurately, or give a full history. VITAL SIGNS:   Filed Vitals:   01/19/15 2015 01/19/15 2030 01/19/15 2045 01/19/15 2232  BP:  168/63  164/81  Pulse: 79 82 85 89  Temp:      TempSrc:      Resp: 19 20 19 22   Height:      Weight:      SpO2: 100% 100% 100% 99%   Wt Readings from Last 3 Encounters:  01/19/15 44.226 kg (97 lb 8 oz)    PHYSICAL EXAMINATION:  Physical Exam  Vitals reviewed. Constitutional: She appears well-developed. No distress.  Cachectic  HENT:  Head: Normocephalic and atraumatic.  Mouth/Throat: Oropharynx is clear and moist.  Eyes: Conjunctivae and EOM are normal. Pupils are equal, round, and reactive to light. No scleral icterus.  Neck: Normal range of motion. Neck supple. No JVD present. No thyromegaly present.  Cardiovascular: Regular rhythm and intact distal pulses.  Exam reveals no gallop and no friction rub.   Murmur (2/6 systolic murmur) heard. Respiratory: Effort normal and breath sounds normal. No respiratory distress. She has no wheezes. She has no rales.  GI: Soft. Bowel sounds are normal. She exhibits no distension. There is no  tenderness.  Musculoskeletal: Normal range of motion. She exhibits no edema.  No arthritis, no gout  Lymphadenopathy:    She has no cervical adenopathy.  Neurological: She is alert. No cranial nerve deficit.  Oriented 2, No dysarthria, no aphasia  Skin: Skin is warm and dry. No rash noted. No erythema.  Psychiatric:  Unable to truly assess given unclear status of the patient's state of orientation at this time.    LABORATORY PANEL:   CBC  Recent Labs Lab 01/19/15 1909  WBC 21.5*  HGB 9.8*  HCT 31.0*  PLT 444*   ------------------------------------------------------------------------------------------------------------------  Chemistries   Recent Labs Lab 01/19/15 1909  NA 134*  K 4.6  CL 101  CO2 22  GLUCOSE 104*  BUN 29*  CREATININE 1.04*  CALCIUM  9.1  AST 33  ALT 14  ALKPHOS 43  BILITOT 0.8   ------------------------------------------------------------------------------------------------------------------  Cardiac Enzymes  Recent Labs Lab 01/19/15 1909  TROPONINI <0.03   ------------------------------------------------------------------------------------------------------------------  RADIOLOGY:  Ct Abdomen Pelvis W Contrast  01/19/2015   CLINICAL DATA:  Nausea, abdominal pain, and right flank pain. Constipation. Frequent urination. Right flank tenderness.  EXAM: CT ABDOMEN AND PELVIS WITH CONTRAST  TECHNIQUE: Multidetector CT imaging of the abdomen and pelvis was performed using the standard protocol following bolus administration of intravenous contrast.  CONTRAST:  31mL OMNIPAQUE IOHEXOL 300 MG/ML  SOLN  COMPARISON:  09/15/2010  FINDINGS: Lower chest:  Moderate-sized hiatal hernia.  Hepatobiliary: Small cyst in segment 4a of the liver, stable. Linear hypodensity inferiorly in the right hepatic lobe on image 21 of series 2, stable. Gallbladder grossly unremarkable.  Pancreas: Abnormally dilated dorsal pancreatic duct in the pancreatic tail, images 19  through 20 of series 2, worsened from prior. The dorsal pancreatic duct itself is mildly prominent, and there is increase in curvilinear cystic prominence in the pancreatic head as on image 24 series 2.  Spleen: Unremarkable  Adrenals/Urinary Tract: Atrophic left kidney. Distal ureters and bladder not well seen due to streak artifact from the patient's bilateral hip implants.  Stomach/Bowel: Scattered air-fluid levels in nondilated small bowel. Abnormal wall thickening and increased luminal caliber in the distal descending colon, sigmoid colon, and rectum. Regional mesenteric edema vertically adjacent to the sigmoid colon. There are some diverticular, such as the sigmoid diverticulum centrally in the pelvis on images 50 through 47 of series 9, but I am uncertain if this is the cause of the regional inflammation of bowel.  Vascular/Lymphatic: Aortoiliac atherosclerotic vascular disease.  Reproductive: Unremarkable  Other: Mild perihepatic and right paracolic gutter ascites. Somewhat diffuse mesenteric edema.  Musculoskeletal: Bilateral hip implants. These obscure lower pelvic findings. Levoconvex lumbar scoliosis with rotary component. Advanced lower thoracic and lumbar spondylosis and degenerative disc disease. Solid interbody fusion at L2- 3 and at L5-S1.  IMPRESSION: 1. The primary problem seem to be in the colon peri there is diffuse colonic dilatation, colon wall thickening, mesenteric edema, abnormal prominence of distal colonic stool, and possibly inflamed diverticula in the sigmoid colon region. There is also prominence of stool in the rectum, and fecal impaction is not excluded. 2. There are abnormal cystic lesions in the pancreas, including tubular lesions in the pancreatic tail and pancreatic head, which are increased in size from the prior exam, and race my concern for the possibilities of entities such as intraductal papillary mucinous tumor. When the patient is no longer in distress and able to suspend  respirations, pancreatic protocol MRI with and without contrast is recommended for further workup. 3. Perihepatic and right paracolic gutter ascites. 4. Other imaging findings of potential clinical significance: Moderate-sized hiatal hernia; atrophic left kidney; aortoiliac atherosclerosis ; lumbar scoliosis, spondylosis, and degenerative disc disease.   Electronically Signed   By: Van Clines M.D.   On: 01/19/2015 22:00    EKG:   Orders placed or performed during the hospital encounter of 01/19/15  . ED EKG  . ED EKG  . EKG 12-Lead  . EKG 12-Lead    IMPRESSION AND PLAN:  Principal Problem:   Sigmoid diverticulitis - with elevated white count. Patient does not meet other SIRS criteria. IV antibiotics given in the ED. We'll continue the same on admission. Surgical consult called from the ED, no indication for surgery at this time. She will be a poor candidate either way.  Active Problems:   Pancreatic cyst - multiple, found on CT abdomen and pelvis. Radiology recommends MRI for further evaluation once her condition is somewhat improved from her current diverticulitis.   HTN (hypertension) - currently significantly elevated in the ED, continue home antihypertensives, blood pressure is improving some with treatment of pain, can use IV when necessary antihypertensives in addition to her home meds as needed for goal blood pressure less than 160/100.   PMR (polymyalgia rheumatica) - continue home medications for this   Dehydration - likely due to poor by mouth intake related to her abdominal pain and nausea, IV fluids for rehydration.   Hypothyroidism - continue home thyroid replacement dose   Osteoarthritis - home medications for analgesia as needed for pain  All the records are reviewed and case discussed with ED provider. Management plans discussed with the patient and/or family.  DVT PROPHYLAXIS: SubQ lovenox  ADMISSION STATUS: Inpatient  CODE STATUS: Full code for now, patient was  unable to clearly respond to this question, family was unavailable, family will need to continue to be contacted to clarify this question  TOTAL TIME TAKING CARE OF THIS PATIENT: 45 minutes.    Von Quint[place]ar [place] 01/20/2015, 12:16 AM  [facility]  Office  [phone]  CC: Primary care physician; Marcello Fennel, MD

## 2015-01-20 NOTE — Care Management Important Message (Signed)
Important Message  Patient Details  Name: Kristen Church MRN: [id number] Date of Birth: 04/01/1923   Medicare Important Message Given:  Yes-second notification given    Alvie Heidelberg, RN 01/20/2015, 10:24 AM

## 2015-01-20 NOTE — ED Notes (Signed)
In and out cath done, return 95mls clear yellow urine. Pt tolerated it well

## 2015-01-20 NOTE — Progress Notes (Signed)
[place] at [place] NAME: Kristen Church    MR#:  [id number]  DATE OF BIRTH:  1922-06-28  SUBJECTIVE: Seen today. Patient admitted for abdominal pain, sigmoid diverticulitis. She says no abdominal pain at this time no nausea no vomiting or diarrhea. She is asymptomatic this time.   CHIEF COMPLAINT:   Chief Complaint  Patient presents with  . Nausea  . Abdominal Pain    REVIEW OF SYSTEMS:    Review of Systems  Constitutional: Negative for fever and chills.  HENT: Negative for hearing loss.   Eyes: Negative for blurred vision, double vision and photophobia.  Respiratory: Negative for cough, hemoptysis and shortness of breath.   Cardiovascular: Negative for palpitations, orthopnea and leg swelling.  Gastrointestinal: Negative for vomiting, abdominal pain and diarrhea.  Genitourinary: Negative for dysuria and urgency.  Musculoskeletal: Negative for myalgias and neck pain.  Skin: Negative for rash.  Neurological: Negative for dizziness, focal weakness, seizures, weakness and headaches.  Psychiatric/Behavioral: Negative for memory loss. The patient does not have insomnia.     Nutrition:  Tolerating Diet: Tolerating PT:      DRUG ALLERGIES:   Allergies  Allergen Reactions  . Celebrex [Celecoxib]     confusion  . Macrobid [Nitrofurantoin Monohyd Macro]     Stiffness, redness, hot feeling  . Neurontin [Gabapentin]     dizziness  . Sulfa Antibiotics     VITALS:  Blood pressure 136/55, pulse 79, temperature 97.7 F (36.5 C), temperature source Oral, resp. rate 17, height 4\' 9"  (1.448 m), weight 44.226 kg (97 lb 8 oz), SpO2 100 %.  PHYSICAL EXAMINATION:   Physical Exam  GENERAL:  81 y.o.-year-old patient lying in the bed with no acute distress.  EYES: Pupils equal, round, reactive to light and accommodation. No scleral icterus. Extraocular muscles intact.  HEENT: Head atraumatic, normocephalic. Oropharynx and  nasopharynx clear.  NECK:  Supple, no jugular venous distention. No thyroid enlargement, no tenderness.  LUNGS: Normal breath sounds bilaterally, no wheezing, rales,rhonchi or crepitation. No use of accessory muscles of respiration.  CARDIOVASCULAR: S1, S2 normal. No murmurs, rubs, or gallops.  ABDOMEN: Soft, nontender, nondistended. Bowel sounds present. No organomegaly or mass.  EXTREMITIES: No pedal edema, cyanosis, or clubbing.  NEUROLOGIC: Cranial nerves II through XII are intact. Muscle strength 5/5 in all extremities. Sensation intact. Gait not checked.  PSYCHIATRIC: The patient is alert and oriented x 3.  SKIN: No obvious rash, lesion, or ulcer.    LABORATORY PANEL:   CBC  Recent Labs Lab 01/20/15 0512  WBC 23.6*  HGB 8.5*  HCT 26.2*  PLT 355   ------------------------------------------------------------------------------------------------------------------  Chemistries   Recent Labs Lab 01/19/15 1909 01/20/15 0512  NA 134* 134*  K 4.6 3.7  CL 101 102  CO2 22 25  GLUCOSE 104* 90  BUN 29* 24*  CREATININE 1.04* 1.14*  CALCIUM 9.1 8.0*  AST 33  --   ALT 14  --   ALKPHOS 43  --   BILITOT 0.8  --    ------------------------------------------------------------------------------------------------------------------  Cardiac Enzymes  Recent Labs Lab 01/19/15 1909  TROPONINI <0.03   ------------------------------------------------------------------------------------------------------------------  RADIOLOGY:  Ct Abdomen Pelvis W Contrast  01/19/2015   CLINICAL DATA:  Nausea, abdominal pain, and right flank pain. Constipation. Frequent urination. Right flank tenderness.  EXAM: CT ABDOMEN AND PELVIS WITH CONTRAST  TECHNIQUE: Multidetector CT imaging of the abdomen and pelvis was performed using the standard protocol following bolus administration of intravenous contrast.  CONTRAST:  14mL OMNIPAQUE IOHEXOL 300 MG/ML  SOLN  COMPARISON:  09/15/2010  FINDINGS: Lower  chest:  Moderate-sized hiatal hernia.  Hepatobiliary: Small cyst in segment 4a of the liver, stable. Linear hypodensity inferiorly in the right hepatic lobe on image 21 of series 2, stable. Gallbladder grossly unremarkable.  Pancreas: Abnormally dilated dorsal pancreatic duct in the pancreatic tail, images 19 through 20 of series 2, worsened from prior. The dorsal pancreatic duct itself is mildly prominent, and there is increase in curvilinear cystic prominence in the pancreatic head as on image 24 series 2.  Spleen: Unremarkable  Adrenals/Urinary Tract: Atrophic left kidney. Distal ureters and bladder not well seen due to streak artifact from the patient's bilateral hip implants.  Stomach/Bowel: Scattered air-fluid levels in nondilated small bowel. Abnormal wall thickening and increased luminal caliber in the distal descending colon, sigmoid colon, and rectum. Regional mesenteric edema vertically adjacent to the sigmoid colon. There are some diverticular, such as the sigmoid diverticulum centrally in the pelvis on images 50 through 47 of series 9, but I am uncertain if this is the cause of the regional inflammation of bowel.  Vascular/Lymphatic: Aortoiliac atherosclerotic vascular disease.  Reproductive: Unremarkable  Other: Mild perihepatic and right paracolic gutter ascites. Somewhat diffuse mesenteric edema.  Musculoskeletal: Bilateral hip implants. These obscure lower pelvic findings. Levoconvex lumbar scoliosis with rotary component. Advanced lower thoracic and lumbar spondylosis and degenerative disc disease. Solid interbody fusion at L2- 3 and at L5-S1.  IMPRESSION: 1. The primary problem seem to be in the colon peri there is diffuse colonic dilatation, colon wall thickening, mesenteric edema, abnormal prominence of distal colonic stool, and possibly inflamed diverticula in the sigmoid colon region. There is also prominence of stool in the rectum, and fecal impaction is not excluded. 2. There are abnormal  cystic lesions in the pancreas, including tubular lesions in the pancreatic tail and pancreatic head, which are increased in size from the prior exam, and race my concern for the possibilities of entities such as intraductal papillary mucinous tumor. When the patient is no longer in distress and able to suspend respirations, pancreatic protocol MRI with and without contrast is recommended for further workup. 3. Perihepatic and right paracolic gutter ascites. 4. Other imaging findings of potential clinical significance: Moderate-sized hiatal hernia; atrophic left kidney; aortoiliac atherosclerosis ; lumbar scoliosis, spondylosis, and degenerative disc disease.   Electronically Signed   By: Van Clines M.D.   On: 01/19/2015 22:00     ASSESSMENT AND PLAN:   Principal Problem:   Sigmoid diverticulitis Active Problems:   Pancreatic cyst   HTN (hypertension)   PMR (polymyalgia rheumatica)   Hypothyroidism   Osteoarthritis   Dehydration   #1 sigmoid diverticulitis ; symptoms are improved. Continue IV fluids, start clear liquid diet. Continue Zosyn. If she tolerates the clear liquids today, will discontinue IV fluids tomorrow. WBC up l but she is asymptomatic. #2 hypertension controlled #3 history of polymyalgia rheumatica: Continue prednisone, pain medications, Plaquenil. Hyperlipidemia continue start this next and hypothyroidism continue Synthroid.     All the records are reviewed and case discussed with Care Management/Social Workerr. Management plans discussed with the patient, family and they are in agreement.  CODE STATUS: full  TOTAL TIME TAKING CARE OF THIS PATIENT: 35 minutes.   POSSIBLE D/C IN 1-2 DAYS, DEPENDING ON CLINICAL CONDITION.   Epifanio Lesches M.D on 01/20/2015 at 10:25 AM  Between 7am to 6pm - Pager - [phone]  After 6pm go to [email or web address] - password [email or web address] [facility]  [facility]  Office  [phone]  CC: Primary care physician;  BABAOFF, Caryl Bis, MD

## 2015-01-20 NOTE — Progress Notes (Signed)
Initial Nutrition Assessment   INTERVENTION:   Coordination of Care: await diet advancement Medical Food Supplement Therapy: will recommend Boost Breeze po TID, each supplement provides 250 kcal and 9 grams of protein   NUTRITION DIAGNOSIS:   Inadequate oral intake related to acute illness as evidenced by  (NPO/CL since admission).  GOAL:   Patient will meet greater than or equal to 90% of their needs  MONITOR:    (Energy Intake, Digestive system, Anthropometrics)  REASON FOR ASSESSMENT:   Malnutrition Screening Tool    ASSESSMENT:   Pt admitted with n/v secondary to diverticulitis and pancreatic cyst.  Past Medical History  Diagnosis Date  . Hypertension   . PMR (polymyalgia rheumatica)   . PVD (peripheral vascular disease)   . Renal artery stenosis   . Osteoarthritis   . HLD (hyperlipidemia)   . Hypothyroidism   . Osteoporosis, post-menopausal     Diet Order:  Diet clear liquid Room service appropriate?: Yes; Fluid consistency:: Thin    Current Nutrition: Pt hungry on visit, awaiting CL tray  Food/Nutrition-Related History: Pt reports usually eating 2 and a half meals per day with a good appetite PTA. Later in conversation pt also stated she has not been eating PTA. RD does note pt admitted with n/v PTA. Pt reports drinking Butter Pecan Ensure and liking.    Medications: NS at 42AS/TM, prednisone, folic acid  Electrolyte/Renal Profile and Glucose Profile:   Recent Labs Lab 01/19/15 1909 01/20/15 0512  NA 134* 134*  K 4.6 3.7  CL 101 102  CO2 22 25  BUN 29* 24*  CREATININE 1.04* 1.14*  CALCIUM 9.1 8.0*  GLUCOSE 104* 90   Protein Profile:  Recent Labs Lab 01/19/15 1909  ALBUMIN 3.9    Gastrointestinal Profile: Last BM: 01/20/2015   Nutrition-Focused Physical Exam Findings: Nutrition-Focused physical exam completed. Findings are no fat depletion, mild muscle depletion of lower extremities, and no edema.     Weight Change: Pt reports  stable weight PTA. Pt does not know current weight.  Height:   Ht Readings from Last 1 Encounters:  01/19/15 4\' 9"  (1.448 m)    Weight:   Wt Readings from Last 1 Encounters:  01/19/15 97 lb 8 oz (44.226 kg)    BMI:  Body mass index is 21.09 kg/(m^2).  Estimated Nutritional Needs:   Kcal:  BEE: 733kcals, TEE: (IF 1.1-1.3)(AF 1.2) 969-1145kcals  Protein:  35-44g protein (0.8-1.0g/kg)   Fluid:  1100-1366mL of fluid (25-24mL/kg)  EDUCATION NEEDS:   No education needs identified at this time   [place], RD, LDN Pager [phone]

## 2015-01-21 LAB — CBC
HCT: 28.2 % — ABNORMAL LOW (ref 35.0–47.0)
Hemoglobin: 8.9 g/dL — ABNORMAL LOW (ref 12.0–16.0)
MCH: 27.2 pg (ref 26.0–34.0)
MCHC: 31.6 g/dL — ABNORMAL LOW (ref 32.0–36.0)
MCV: 86.3 fL (ref 80.0–100.0)
PLATELETS: 387 10*3/uL (ref 150–440)
RBC: 3.27 MIL/uL — AB (ref 3.80–5.20)
RDW: 19.7 % — ABNORMAL HIGH (ref 11.5–14.5)
WBC: 15.1 10*3/uL — AB (ref 3.6–11.0)

## 2015-01-21 NOTE — Care Management (Signed)
Spoke with patient for discharge planning. From [facility] independent.  Stated that she has a walker, does not drive, Uses activity van for appointments. PCP is [facility]. No issues reported with obtaining medications. Stated that she was independent prior to hospitalization. Lives alone and has medical alert pendant. Can call for assistance if needed. Apartment all on one level. Patient stated that she has been up walking here and sitting in a chair. Continue to follow for discharge needs.

## 2015-01-21 NOTE — Progress Notes (Signed)
[facility] [place] NAME: Kristen Church    MR#:  [id number]  DATE OF BIRTH:  [birth year]  SUBJECTIVE: Seen today. Patient admitted for abdominal pain, sigmoid diverticulitis.deneis any complaints.  CHIEF COMPLAINT:   Chief Complaint  Patient presents with  . Nausea  . Abdominal Pain    REVIEW OF SYSTEMS:    Review of Systems  Constitutional: Negative for fever and chills.  HENT: Negative for hearing loss.   Eyes: Negative for blurred vision, double vision and photophobia.  Respiratory: Negative for cough, hemoptysis and shortness of breath.   Cardiovascular: Negative for palpitations, orthopnea and leg swelling.  Gastrointestinal: Negative for vomiting, abdominal pain and diarrhea.  Genitourinary: Negative for dysuria and urgency.  Musculoskeletal: Negative for myalgias and neck pain.  Skin: Negative for rash.  Neurological: Negative for dizziness, focal weakness, seizures, weakness and headaches.  Psychiatric/Behavioral: Negative for memory loss. The patient does not have insomnia.     Nutrition:  Tolerating Diet: Tolerating PT:      DRUG ALLERGIES:   Allergies  Allergen Reactions  . Celebrex [Celecoxib]     confusion  . Macrobid [Nitrofurantoin Monohyd Macro]     Stiffness, redness, hot feeling  . Neurontin [Gabapentin]     dizziness  . Sulfa Antibiotics     VITALS:  Blood pressure 184/52, pulse 72, temperature 98.4 F (36.9 C), temperature source Oral, resp. rate 18, height 4\' 9"  (1.448 m), weight 44.226 kg (97 lb 8 oz), SpO2 100 %.  PHYSICAL EXAMINATION:   Physical Exam  GENERAL:  81 y.o.-year-old patient lying in the bed with no acute distress.  EYES: Pupils equal, round, reactive to light and accommodation. No scleral icterus. Extraocular muscles intact.  HEENT: Head atraumatic, normocephalic. Oropharynx and nasopharynx clear.  NECK:  Supple, no jugular venous distention. No thyroid enlargement, no  tenderness.  LUNGS: Normal breath sounds bilaterally, no wheezing, rales,rhonchi or crepitation. No use of accessory muscles of respiration.  CARDIOVASCULAR: S1, S2 normal. No murmurs, rubs, or gallops.  ABDOMEN: Soft, nontender, nondistended. Bowel sounds present. No organomegaly or mass.  EXTREMITIES: No pedal edema, cyanosis, or clubbing.  NEUROLOGIC: Cranial nerves II through XII are intact. Muscle strength 5/5 in all extremities. Sensation intact. Gait not checked.  PSYCHIATRIC: The patient is alert and oriented x 3.  SKIN: No obvious rash, lesion, or ulcer.    LABORATORY PANEL:   CBC  Recent Labs Lab 01/21/15 0423  WBC 15.1*  HGB 8.9*  HCT 28.2*  PLT 387   ------------------------------------------------------------------------------------------------------------------  Chemistries   Recent Labs Lab 01/19/15 1909 01/20/15 0512  NA 134* 134*  K 4.6 3.7  CL 101 102  CO2 22 25  GLUCOSE 104* 90  BUN 29* 24*  CREATININE 1.04* 1.14*  CALCIUM 9.1 8.0*  AST 33  --   ALT 14  --   ALKPHOS 43  --   BILITOT 0.8  --    ------------------------------------------------------------------------------------------------------------------  Cardiac Enzymes  Recent Labs Lab 01/19/15 1909  TROPONINI <0.03   ------------------------------------------------------------------------------------------------------------------  RADIOLOGY:  Ct Abdomen Pelvis W Contrast  01/19/2015   CLINICAL DATA:  Nausea, abdominal pain, and right flank pain. Constipation. Frequent urination. Right flank tenderness.  EXAM: CT ABDOMEN AND PELVIS WITH CONTRAST  TECHNIQUE: Multidetector CT imaging of the abdomen and pelvis was performed using the standard protocol following bolus administration of intravenous contrast.  CONTRAST:  39mL OMNIPAQUE IOHEXOL 300 MG/ML  SOLN  COMPARISON:  09/15/2010  FINDINGS: Lower chest:  Moderate-sized  hiatal hernia.  Hepatobiliary: Small cyst in segment 4a of the liver,  stable. Linear hypodensity inferiorly in the right hepatic lobe on image 21 of series 2, stable. Gallbladder grossly unremarkable.  Pancreas: Abnormally dilated dorsal pancreatic duct in the pancreatic tail, images 19 through 20 of series 2, worsened from prior. The dorsal pancreatic duct itself is mildly prominent, and there is increase in curvilinear cystic prominence in the pancreatic head as on image 24 series 2.  Spleen: Unremarkable  Adrenals/Urinary Tract: Atrophic left kidney. Distal ureters and bladder not well seen due to streak artifact from the patient's bilateral hip implants.  Stomach/Bowel: Scattered air-fluid levels in nondilated small bowel. Abnormal wall thickening and increased luminal caliber in the distal descending colon, sigmoid colon, and rectum. Regional mesenteric edema vertically adjacent to the sigmoid colon. There are some diverticular, such as the sigmoid diverticulum centrally in the pelvis on images 50 through 47 of series 9, but I am uncertain if this is the cause of the regional inflammation of bowel.  Vascular/Lymphatic: Aortoiliac atherosclerotic vascular disease.  Reproductive: Unremarkable  Other: Mild perihepatic and right paracolic gutter ascites. Somewhat diffuse mesenteric edema.  Musculoskeletal: Bilateral hip implants. These obscure lower pelvic findings. Levoconvex lumbar scoliosis with rotary component. Advanced lower thoracic and lumbar spondylosis and degenerative disc disease. Solid interbody fusion at L2- 3 and at L5-S1.  IMPRESSION: 1. The primary problem seem to be in the colon peri there is diffuse colonic dilatation, colon wall thickening, mesenteric edema, abnormal prominence of distal colonic stool, and possibly inflamed diverticula in the sigmoid colon region. There is also prominence of stool in the rectum, and fecal impaction is not excluded. 2. There are abnormal cystic lesions in the pancreas, including tubular lesions in the pancreatic tail and pancreatic  head, which are increased in size from the prior exam, and race my concern for the possibilities of entities such as intraductal papillary mucinous tumor. When the patient is no longer in distress and able to suspend respirations, pancreatic protocol MRI with and without contrast is recommended for further workup. 3. Perihepatic and right paracolic gutter ascites. 4. Other imaging findings of potential clinical significance: Moderate-sized hiatal hernia; atrophic left kidney; aortoiliac atherosclerosis ; lumbar scoliosis, spondylosis, and degenerative disc disease.   Electronically Signed   By: Van Clines M.D.   On: 01/19/2015 22:00     ASSESSMENT AND PLAN:   Principal Problem:   Sigmoid diverticulitis Active Problems:   Pancreatic cyst   HTN (hypertension)   PMR (polymyalgia rheumatica)   Hypothyroidism   Osteoarthritis   Dehydration   #1 sigmoid diverticulitis ; symptoms are improved.  start  Regular diet. Continue Zosyn. , will discontinue IV fluids tomorrow.  #2 hypertension controlled #3 history of polymyalgia rheumatica: Continue prednisone, pain medications, Plaquenil. Hyperlipidemia continue start this next and hypothyroidism continue Synthroid. likley d/c am    All the records are reviewed and case discussed with Care Management/Social Workerr. Management plans discussed with the patient, family and they are in agreement.  CODE STATUS: full  TOTAL TIME TAKING CARE OF THIS PATIENT: 35 minutes.   POSSIBLE D/C IN 1-2 DAYS, DEPENDING ON CLINICAL CONDITION.   Epifanio Lesches M.D on 01/21/2015 at 12:34 PM  Between 7am to 6pm - Pager - [phone]  After 6pm go to [email or web address] - password [email or web address] [facility]  Office  [phone]  CC: Primary care physician; BABAOFF, Caryl Bis, MD

## 2015-01-21 NOTE — Progress Notes (Addendum)
Patient ID: Kristen Church, female   DOB: Nov 27, 1922, 81 y.o.   MRN: [id number]   [facility]   PATIENT NAME: Kristen Church    MR#:  [id number]  DATE OF BIRTH:  1923/02/12  SUBJECTIVE:  She's had a bowel movement and denies any abdominal pain and is hungry tolerating a full liquid diet.  REVIEW OF SYSTEMS:   Review of Systems  Constitutional: Negative for fever.  Eyes: Negative.   Respiratory: Negative for cough and hemoptysis.   Gastrointestinal: Positive for constipation. Negative for heartburn, nausea, vomiting and abdominal pain.  Neurological: Positive for dizziness.  Psychiatric/Behavioral: Negative.   All other systems reviewed and are negative.   DRUG ALLERGIES:   Allergies  Allergen Reactions  . Celebrex [Celecoxib]     confusion  . Macrobid [Nitrofurantoin Monohyd Macro]     Stiffness, redness, hot feeling  . Neurontin [Gabapentin]     dizziness  . Sulfa Antibiotics     VITALS:  Blood pressure 179/47, pulse 72, temperature 98.4 F (36.9 C), temperature source Oral, resp. rate 18, height 4\' 9"  (1.448 m), weight 97 lb 8 oz (44.226 kg), SpO2 100 %.  PHYSICAL EXAMINATION:  GENERAL:  81 y.o.-year-old patient sitting upright at bedside chair with no acute distress.  EYES: Pupils equal, round, reactive to light and accommodation. No scleral icterus. Extraocular muscles intact.  HEENT: Head atraumatic, normocephalic. Oropharynx and nasopharynx clear.  NECK:  Supple, no jugular venous distention. No thyroid enlargement, no tenderness.  LUNGS: Normal breath sounds bilaterally, no wheezing, rales,rhonchi or crepitation. No use of accessory muscles of respiration.  CARDIOVASCULAR: S1, S2 normal. No murmurs, rubs, or gallops.  ABDOMEN: Soft, nontender, nondistended.  EXTREMITIES: No pedal edema, cyanosis, or clubbing.  NEUROLOGIC: Cranial nerves II through XII are intact. Muscle strength 5/5 in all extremities. Sensation intact. Gait not checked.  PSYCHIATRIC:  The patient is alert and oriented x 3.      ASSESSMENT AND PLAN:   Possible early sigmoid diverticulitis but with no signs of a surgical abdomen or issue. We will sign off. Call with any questions. The patient would would benefit from transition of IV antibiotics to oral antibiotics empirically treating her for 7-10 days for sigmoid diverticulitis.  Call with any questions. Thank you for allowing [place] to participate in the patient's care in the hospital.

## 2015-01-22 DIAGNOSIS — K5732 Diverticulitis of large intestine without perforation or abscess without bleeding: Secondary | ICD-10-CM | POA: Diagnosis not present

## 2015-01-22 LAB — CREATININE, SERUM
CREATININE: 1.23 mg/dL — AB (ref 0.44–1.00)
GFR calc Af Amer: 43 mL/min — ABNORMAL LOW (ref >60)
GFR calc non Af Amer: 37 mL/min — ABNORMAL LOW (ref >60)

## 2015-01-22 MED ORDER — CIPROFLOXACIN HCL 250 MG PO TABS: 250.0000 mg | ORAL_TABLET | Freq: Two times a day (BID) | ORAL | Status: DC

## 2015-01-22 MED ORDER — METRONIDAZOLE 500 MG PO TABS: 500.0000 mg | ORAL_TABLET | Freq: Three times a day (TID) | ORAL | Status: DC

## 2015-01-22 MED ORDER — CIPROFLOXACIN HCL 500 MG PO TABS: 500.0000 mg | ORAL_TABLET | Freq: Two times a day (BID) | ORAL | Status: DC

## 2015-01-22 NOTE — Discharge Instructions (Signed)
°  Abdominal Pain Many things can cause belly (abdominal) pain. Most times, the belly pain is not dangerous. Many cases of belly pain can be watched and treated at home. HOME CARE   Do not take medicines that help you go poop (laxatives) unless told to by your doctor.  Only take medicine as told by your doctor.  Eat or drink as told by your doctor. Your doctor will tell you if you should be on a special diet. GET HELP IF:  You do not know what is causing your belly pain.  You have belly pain while you are sick to your stomach (nauseous) or have runny poop (diarrhea).  You have pain while you pee or poop.  Your belly pain wakes you up at night.  You have belly pain that gets worse or better when you eat.  You have belly pain that gets worse when you eat fatty foods.  You have a fever. GET HELP RIGHT AWAY IF:   The pain does not go away within 2 hours.  You keep throwing up (vomiting).  The pain changes and is only in the right or left part of the belly.  You have bloody or tarry looking poop. MAKE SURE YOU:   Understand these instructions.  Will watch your condition.  Will get help right away if you are not doing well or get worse. Document Released: 10/20/2007 Document Revised: 05/08/2013 Document Reviewed: 01/10/2013 [facility] Patient Information 2015 [place], [place]. This information is not intended to replace advice given to you by your health care provider. Make sure you discuss any questions you have with your health care provider. Diverticulitis Diverticulitis is when small pockets that have formed in your colon (large intestine) become infected or swollen. HOME CARE  Follow your doctor's instructions.  Follow a special diet if told by your doctor.  When you feel better, your doctor may tell you to change your diet. You may be told to eat a lot of fiber. Fruits and vegetables are good sources of fiber. Fiber makes it easier to poop (have bowel movements).  Take  supplements or probiotics as told by your doctor.  Only take medicines as told by your doctor.  Keep all follow-up visits with your doctor. GET HELP IF:  Your pain does not get better.  You have a hard time eating food.  You are not pooping like normal. GET HELP RIGHT AWAY IF:  Your pain gets worse.  Your problems do not get better.  Your problems suddenly get worse.  You have a fever.  You keep throwing up (vomiting).  You have bloody or black, tarry poop (stool). MAKE SURE YOU:   Understand these instructions.  Will watch your condition.  Will get help right away if you are not doing well or get worse. Document Released: 10/20/2007 Document Revised: 05/08/2013 Document Reviewed: 03/28/2013 [facility] Patient Information 2015 [place], [place]. This information is not intended to replace advice given to you by your health care provider. Make sure you discuss any questions you have with your health care provider.

## 2015-01-22 NOTE — Progress Notes (Signed)
Pt stable. D/c instructions given and education provided. IV removed. Flu vaccine given prior to d/c. Pt escorted out by staff and driven home by family friend.

## 2015-01-22 NOTE — Care Management Important Message (Signed)
Important Message  Patient Details  Name: Kristen Church MRN: [id number] Date of Birth: 1923/04/30   Medicare Important Message Given:  Yes-third notification given    Juliann Pulse A Clarita 01/22/2015, 11:08 AM

## 2015-01-22 NOTE — Care Management (Signed)
Discussed discharge with patient who stated she feels ready to go home today. She is alert and oriented , Lives at [facility]. No CM needs identified. Stated that her  nurse ( Alecia)was going to call transportation for her . See previous notes.

## 2015-01-25 NOTE — Discharge Summary (Signed)
Kristen Church, is a 81 y.o. female  DOB 12/17/1922  MRN [id number].  Admission date:  01/19/2015  Admitting Physician  Lance Coon, MD  Discharge Date:  01/22/2015   Primary MD  BABAOFF, Caryl Bis, MD  Recommendations for primary care physician for things to follow:  \follow up primary doctor in 1 week.  Admission Diagnosis  Right lower quadrant abdominal pain [R10.31]   Discharge Diagnosis  Right lower quadrant abdominal pain [R10.31]  *  Principal Problem:   Sigmoid diverticulitis Active Problems:   Pancreatic cyst   HTN (hypertension)   PMR (polymyalgia rheumatica)   Hypothyroidism   Osteoarthritis   Dehydration      Past Medical History  Diagnosis Date  . Hypertension   . PMR (polymyalgia rheumatica)   . PVD (peripheral vascular disease)   . Renal artery stenosis   . Osteoarthritis   . HLD (hyperlipidemia)   . Hypothyroidism   . Osteoporosis, post-menopausal     Past Surgical History  Procedure Laterality Date  . Tonsillectomy    . Elbow surgery Left   . Breast biopsy Right     benign  . Joint replacement Right     1995  . Joint replacement Left     2005  . Temporal artery biopsy / ligation         History of present illness and  Hospital Course:     Kindly see H&P for history of present illness and admission details, please review complete Labs, Consult reports and Test reports for all details in brief  HPI  from the history and physical done on the day of admission  81 year old female patient came in secondary to abdominal pain, nausea. Abdominal CAT scan showed sigmoid diverticulitis on admission, patient white count was 20 on admission.   Hospital Course  1. Sigmoid diverticulitis present on admission patient's symptoms improved with Zosyn. Patient received IV fluids. Initially she was  nothing by mouth. But later  started on clear liquids, advance the diet. Patient tolerated the diet. Seen by surgery, patient did not require any surgical intervention. Her abdominal pain improved. Tolerated the diet.discharged back to [facility]. WBC improved from 21.5-15.1. #2. Hypertension symptoms a of hypertension are controlled. #3 history of polymyalgia rheumatica: Patient is on plaquinel , methotrexate, prednisone, pain medications. #4 hyperlipidemia; continue statins. History of hypothyroid a; continued on Synthyroid. #4 history of pancreatic cyst. Abdominal CAT scan did show worsening. Patient may need MRI as an outpatient.    Discharge Condition: Stable   Follow UP  Follow-up Information    Follow up with BABAOFF, MARC E, MD. Schedule an appointment as soon as possible for a visit on 01/29/2015.   Specialty:  Family Medicine   Why:  Dr. Elzie Rings nurse will call you to schedule a followup visit for one week.  Please call his office if you haven't heard from them by Friday.   Contact information:   [address] [address] [phone]         Discharge Instructions  and  Discharge Medications        Medication List    TAKE these medications        alendronate 70 MG tablet  Commonly known as:  FOSAMAX  Take 70 mg by mouth once a week.     amLODipine-benazepril 5-20 MG per capsule  Commonly known as:  LOTREL  Take 1 capsule by mouth daily.     BYSTOLIC 10 MG  tablet  Generic drug:  nebivolol  Take 10 mg by mouth daily.     ciprofloxacin 250 MG tablet  Commonly known as:  CIPRO  Take 1 tablet (250 mg total) by mouth 2 (two) times daily.     folic acid 1 MG tablet  Commonly known as:  FOLVITE  Take 1 mg by mouth daily.     hydroxychloroquine 200 MG tablet  Commonly known as:  PLAQUENIL  Take 200 mg by mouth daily.     levothyroxine 75 MCG tablet  Commonly known as:  SYNTHROID, LEVOTHROID  Take 75 mcg by mouth  daily before breakfast.     lidocaine 5 %  Commonly known as:  LIDODERM  Place 1 patch onto the skin daily.     methotrexate 2.5 MG tablet  Commonly known as:  RHEUMATREX  Take 15 mg by mouth once a week.     metroNIDAZOLE 500 MG tablet  Commonly known as:  FLAGYL  Take 1 tablet (500 mg total) by mouth 3 (three) times daily.     predniSONE 5 MG tablet  Commonly known as:  DELTASONE  Take 2.5 mg by mouth daily.     ranitidine 150 MG tablet  Commonly known as:  ZANTAC  Take 150 mg by mouth at bedtime.     traMADol 50 MG tablet  Commonly known as:  ULTRAM  Take 2 tablets by mouth every 6 (six) hours.          Diet and Activity recommendation: See Discharge Instructions above   Consults obtained - surgery   Major procedures and Radiology Reports - PLEASE review detailed and final reports for all details, in brief -      Ct Abdomen Pelvis W Contrast  01/19/2015   CLINICAL DATA:  Nausea, abdominal pain, and right flank pain. Constipation. Frequent urination. Right flank tenderness.  EXAM: CT ABDOMEN AND PELVIS WITH CONTRAST  TECHNIQUE: Multidetector CT imaging of the abdomen and pelvis was performed using the standard protocol following bolus administration of intravenous contrast.  CONTRAST:  32mL OMNIPAQUE IOHEXOL 300 MG/ML  SOLN  COMPARISON:  09/15/2010  FINDINGS: Lower chest:  Moderate-sized hiatal hernia.  Hepatobiliary: Small cyst in segment 4a of the liver, stable. Linear hypodensity inferiorly in the right hepatic lobe on image 21 of series 2, stable. Gallbladder grossly unremarkable.  Pancreas: Abnormally dilated dorsal pancreatic duct in the pancreatic tail, images 19 through 20 of series 2, worsened from prior. The dorsal pancreatic duct itself is mildly prominent, and there is increase in curvilinear cystic prominence in the pancreatic head as on image 24 series 2.  Spleen: Unremarkable  Adrenals/Urinary Tract: Atrophic left kidney. Distal ureters and bladder not well  seen due to streak artifact from the patient's bilateral hip implants.  Stomach/Bowel: Scattered air-fluid levels in nondilated small bowel. Abnormal wall thickening and increased luminal caliber in the distal descending colon, sigmoid colon, and rectum. Regional mesenteric edema vertically adjacent to the sigmoid colon. There are some diverticular, such as the sigmoid diverticulum centrally in the pelvis on images 50 through 47 of series 9, but I am uncertain if this is the cause of the regional inflammation of bowel.  Vascular/Lymphatic: Aortoiliac atherosclerotic vascular disease.  Reproductive: Unremarkable  Other: Mild perihepatic and right paracolic gutter ascites. Somewhat diffuse mesenteric edema.  Musculoskeletal: Bilateral hip implants. These obscure lower pelvic findings. Levoconvex lumbar scoliosis with rotary component. Advanced lower thoracic and lumbar spondylosis and degenerative disc disease. Solid interbody fusion at L2- 3 and at  L5-S1.  IMPRESSION: 1. The primary problem seem to be in the colon peri there is diffuse colonic dilatation, colon wall thickening, mesenteric edema, abnormal prominence of distal colonic stool, and possibly inflamed diverticula in the sigmoid colon region. There is also prominence of stool in the rectum, and fecal impaction is not excluded. 2. There are abnormal cystic lesions in the pancreas, including tubular lesions in the pancreatic tail and pancreatic head, which are increased in size from the prior exam, and race my concern for the possibilities of entities such as intraductal papillary mucinous tumor. When the patient is no longer in distress and able to suspend respirations, pancreatic protocol MRI with and without contrast is recommended for further workup. 3. Perihepatic and right paracolic gutter ascites. 4. Other imaging findings of potential clinical significance: Moderate-sized hiatal hernia; atrophic left kidney; aortoiliac atherosclerosis ; lumbar  scoliosis, spondylosis, and degenerative disc disease.   Electronically Signed   By: Van Clines M.D.   On: 01/19/2015 22:00    Micro Results     No results found for this or any previous visit (from the past 240 hour(s)).     Today   Subjective:   Celsey Asselin today has no headache,no chest abdominal pain,no new weakness tingling or numbness, feels much better wants to go home today.   Objective:   Blood pressure 156/54, pulse 77, temperature 98 F (36.7 C), temperature source Oral, resp. rate 18, height 4\' 9"  (1.448 m), weight 44.226 kg (97 lb 8 oz), SpO2 100 %.  No intake or output data in the 24 hours ending 01/25/15 0953  Exam Awake Alert, Oriented x 3, No new F.N deficits, Normal affect [place].AT,PERRAL Supple Neck,No JVD, No cervical lymphadenopathy appriciated.  Symmetrical Chest wall movement, Good air movement bilaterally, CTAB RRR,No Gallops,Rubs or new Murmurs, No Parasternal Heave +ve B.Sounds, Abd Soft, Non tender, No organomegaly appriciated, No rebound -guarding or rigidity. No Cyanosis, Clubbing or edema, No new Rash or bruise  Data Review   CBC w Diff:  Lab Results  Component Value Date   WBC 15.1* 01/21/2015   WBC 8.7 05/27/2014   HGB 8.9* 01/21/2015   HGB 8.8* 05/27/2014   HCT 28.2* 01/21/2015   HCT 28.8* 05/27/2014   PLT 387 01/21/2015   PLT 489* 05/27/2014   LYMPHOPCT 10.1 05/27/2014   MONOPCT 7.4 05/27/2014   EOSPCT 0.3 05/27/2014   BASOPCT 1.2 05/27/2014    CMP:  Lab Results  Component Value Date   NA 134* 01/20/2015   NA 138 05/27/2014   K 3.7 01/20/2015   K 4.1 05/27/2014   CL 102 01/20/2015   CL 106 05/27/2014   CO2 25 01/20/2015   CO2 24 05/27/2014   BUN 24* 01/20/2015   BUN 24* 05/27/2014   CREATININE 1.23* 01/22/2015   CREATININE 1.20 05/27/2014   PROT 6.6 01/19/2015   PROT 6.7 02/17/2013   ALBUMIN 3.9 01/19/2015   ALBUMIN 3.3* 02/17/2013   BILITOT 0.8 01/19/2015   BILITOT 0.7 02/17/2013   ALKPHOS 43 01/19/2015    ALKPHOS 63 02/17/2013   AST 33 01/19/2015   AST 34 02/17/2013   ALT 14 01/19/2015   ALT 24 02/17/2013  .   Total Time in preparing paper work, data evaluation and todays exam - 20 minutes  Taiki Buckwalter M.D on 01/22/2015 at 9:53 AM

## 2015-03-18 ENCOUNTER — Ambulatory Visit (INDEPENDENT_AMBULATORY_CARE_PROVIDER_SITE_OTHER): Payer: Medicare Other | Admitting: Obstetrics and Gynecology

## 2015-03-18 ENCOUNTER — Encounter: Payer: Self-pay | Admitting: Obstetrics and Gynecology

## 2015-03-18 VITALS — BP 207/70 | HR 66 | Ht <= 58 in | Wt 92.3 lb

## 2015-03-18 DIAGNOSIS — N390 Urinary tract infection, site not specified: Secondary | ICD-10-CM | POA: Diagnosis not present

## 2015-03-18 DIAGNOSIS — I10 Essential (primary) hypertension: Secondary | ICD-10-CM | POA: Diagnosis not present

## 2015-03-18 LAB — POCT URINALYSIS DIPSTICK
Bilirubin, UA: NEGATIVE
GLUCOSE UA: NEGATIVE
Ketones, UA: NEGATIVE
NITRITE UA: NEGATIVE
PH UA: 6
PROTEIN UA: NEGATIVE
SPEC GRAV UA: 1.02
UROBILINOGEN UA: 0.2

## 2015-03-18 NOTE — Progress Notes (Signed)
Patient ID: Kristen Church, female   DOB: 1923-03-26, 81 y.o.   MRN: [id number] Refer from dr Loney Hering Recurrent uti- 2 in 6 months  SUBJECTIVE:  Chief complaints: 1. Referral from Dr. Loney Hering for recurrent dysuria, pelvic pressure, and sensation of needing to void, with usually unremarkable urinalysis.  2. Elevated BP today - denies HA, CP, vision changes, weakness. Recently decreased her Nebivolol to half dose per PCP due to dizziness which resolved. Does not check BP at home.  HPI:  Kristen Church is a 81yo G0 female presenting for referral from Dr. Loney Hering for bladder infections (2-3/year) and frequent dysuria without positive cultures on UTI. She is asymptomatic today. Last treated with Keflex one week ago for trace leukocytes seen on UA. Denies burning, frequency, hesitancy, blood in urine, incomplete emptying, incontinence, nocturia. No vaginal discharge.  Denies any pelvic pressure or bulging. Normal BM, no constipation. No abnormal gynecologic history, still has uterus and ovaries. Says she recently had a gynecologic visit (cannot remember provider) and was told she had no anatomic abnormalities that may be contributing to her symptoms. Reports that she only urinates once a day, occasionally not at all in one day. She does not drink many fluids during the day. She lives at [facility].   Review of Systems  Constitutional: Negative for fever and chills.  Eyes: Negative for blurred vision.  Respiratory: Negative for cough.   Cardiovascular: Negative for chest pain.  Gastrointestinal: Negative for constipation.  Genitourinary: Negative for dysuria, urgency, frequency, hematuria and flank pain.  Neurological: Negative for headaches.    Past Medical History  Diagnosis Date  . Hypertension   . PMR (polymyalgia rheumatica) (HCC)   . PVD (peripheral vascular disease) ([place])   . Renal artery stenosis ([place])   . Osteoarthritis   . HLD (hyperlipidemia)   . Hypothyroidism   . Osteoporosis,  post-menopausal     Current Outpatient Prescriptions on File Prior to Visit  Medication Sig Dispense Refill  . alendronate (FOSAMAX) 70 MG tablet Take 70 mg by mouth once a week.   1  . amLODipine-benazepril (LOTREL) 5-20 MG per capsule Take 1 capsule by mouth daily.  1  . BYSTOLIC 10 MG tablet Take 10 mg by mouth daily.   0  . folic acid (FOLVITE) 1 MG tablet Take 1 mg by mouth daily.   1  . hydroxychloroquine (PLAQUENIL) 200 MG tablet Take 200 mg by mouth daily.   0  . levothyroxine (SYNTHROID, LEVOTHROID) 75 MCG tablet Take 75 mcg by mouth daily before breakfast.   0  . lidocaine (LIDODERM) 5 % Place 1 patch onto the skin daily.   0  . predniSONE (DELTASONE) 5 MG tablet Take 2.5 mg by mouth daily.  0  . traMADol (ULTRAM) 50 MG tablet Take 2 tablets by mouth every 6 (six) hours.  0   No current facility-administered medications on file prior to visit.     OBJECTIVE:  Filed Vitals:   03/18/15 1231  BP: 207/70  Pulse: 66   General: Pleasant, oriented elderly woman Pelvic exam not done today   ASSESSMENT: 1. Decreased urine production vs. Decreased frequency of bladder emptying - may be secondary to low fluid intake. No indication that this is a pelvic organ prolapse or gynecologic origin.  2. History of recurrent UTI 3. Uncontrolled hypertension   PLAN: 1. Order: Urine dipstick, urinalysis 2. Increase amount of water intake throughout the day, and drink 1 glass of cranberry juice per day 3. Schedule regular attempts at  urination throughout the day, 4-5x/day 4. Follow up with PCP for blood pressure control 5. Follow up at our office PRN  Thoroughly discussed with patient that her urinary symptoms/recurring UTIs may be attributed to only urinating once daily. Recommended increasing fluid intake and drinking cranberry juice as prophylaxis to prevent infections. Also recommend scheduled attempts to urinate, 4-5x/day.   Based on her symptoms, there does not appear to be any  gynecologic reason for her urinary symptoms, such as a prolapsed organ, so a pelvic exam was not done today. Urine specimens were sent off for analysis.  Patient understands she is to see PCP for uncontrolled blood pressure based off of today's BP reading.   Hubert Azure, PA-S Alanda Slim Searra Carnathan, MD  A total of 25 minutes were spent face-to-face with the patient during this encounter and over half of that time involved counseling and coordination of care.   I have seen, interviewed, and examined the patient in conjunction with the [facility].A. student and affirm the diagnosis and management plan. Ivonne [place] A. Azile Minardi, MD, Cherlynn June

## 2015-03-18 NOTE — Patient Instructions (Signed)
1. Order: Urine dipstick, urinalysis 2. Increase amount of water intake throughout the day, and drink 1 glass of cranberry juice per day 3. Schedule regular attempts at urination throughout the day, 4-5x/day 4. Follow up with PCP for blood pressure control 5. Follow up at our office PRN

## 2015-03-21 LAB — URINE CULTURE

## 2015-06-03 ENCOUNTER — Emergency Department
Admission: EM | Admit: 2015-06-03 | Discharge: 2015-06-03 | Disposition: A | Discharge status: Home / Self Care | Payer: Medicare Other | Attending: Emergency Medicine | Admitting: Emergency Medicine

## 2015-06-03 ENCOUNTER — Encounter: Payer: Self-pay | Admitting: *Deleted

## 2015-06-03 DIAGNOSIS — E871 Hypo-osmolality and hyponatremia: Secondary | ICD-10-CM | POA: Insufficient documentation

## 2015-06-03 DIAGNOSIS — R531 Weakness: Secondary | ICD-10-CM | POA: Diagnosis present

## 2015-06-03 DIAGNOSIS — Z79899 Other long term (current) drug therapy: Secondary | ICD-10-CM | POA: Diagnosis not present

## 2015-06-03 DIAGNOSIS — Z7952 Long term (current) use of systemic steroids: Secondary | ICD-10-CM | POA: Diagnosis not present

## 2015-06-03 DIAGNOSIS — I10 Essential (primary) hypertension: Secondary | ICD-10-CM | POA: Diagnosis not present

## 2015-06-03 LAB — URINALYSIS COMPLETE WITH MICROSCOPIC (ARMC ONLY)
BACTERIA UA: NONE SEEN
Bilirubin Urine: NEGATIVE
Glucose, UA: NEGATIVE mg/dL
HGB URINE DIPSTICK: NEGATIVE
KETONES UR: NEGATIVE mg/dL
Leukocytes, UA: NEGATIVE
Nitrite: NEGATIVE
PROTEIN: NEGATIVE mg/dL
SPECIFIC GRAVITY, URINE: 1.009 (ref 1.005–1.030)
pH: 7 (ref 5.0–8.0)

## 2015-06-03 LAB — BASIC METABOLIC PANEL
Anion gap: 8 (ref 5–15)
BUN: 19 mg/dL (ref 6–20)
CHLORIDE: 97 mmol/L — AB (ref 101–111)
CO2: 26 mmol/L (ref 22–32)
Calcium: 9.1 mg/dL (ref 8.9–10.3)
Creatinine, Ser: 1.09 mg/dL — ABNORMAL HIGH (ref 0.44–1.00)
GFR calc Af Amer: 50 mL/min — ABNORMAL LOW (ref >60)
GFR, EST NON AFRICAN AMERICAN: 43 mL/min — AB (ref >60)
GLUCOSE: 109 mg/dL — AB (ref 65–99)
POTASSIUM: 4 mmol/L (ref 3.5–5.1)
Sodium: 131 mmol/L — ABNORMAL LOW (ref 135–145)

## 2015-06-03 LAB — CBC
HEMATOCRIT: 29.7 % — AB (ref 35.0–47.0)
HEMOGLOBIN: 9.6 g/dL — AB (ref 12.0–16.0)
MCH: 25.8 pg — AB (ref 26.0–34.0)
MCHC: 32.4 g/dL (ref 32.0–36.0)
MCV: 79.6 fL — AB (ref 80.0–100.0)
Platelets: 354 10*3/uL (ref 150–440)
RBC: 3.73 MIL/uL — AB (ref 3.80–5.20)
RDW: 22.6 % — ABNORMAL HIGH (ref 11.5–14.5)
WBC: 7.6 10*3/uL (ref 3.6–11.0)

## 2015-06-03 MED ORDER — LABETALOL HCL 5 MG/ML IV SOLN
10.0000 mg | Freq: Once | INTRAVENOUS | Status: AC
Start: 2015-06-03 — End: 2015-06-03
  Administered 2015-06-03: 10 mg via INTRAVENOUS
  Filled 2015-06-03: qty 4

## 2015-06-03 NOTE — ED Notes (Signed)
Pt arrived to ED via EMS from [facility]. Caregiver verbalized pt had a fever and chills but EMS denies fever upon their arrival and pt is denying chills to RN at this time. Pt verbalized becoming worried today after weakness she was feeling yesterday from yesterday did not go away. Pt does verbalize having body aches that increased with touch. Pt is alert and oriented x 4 and is reported to be at baseline.

## 2015-06-03 NOTE — Discharge Instructions (Signed)
Please make a follow-up appointment with Dr. Baldemar Lenis to discuss your elevated blood pressure as well as your low sodium. It is possible that your doctor will want to re-evaluate the artery that goes to her kidney.  Please return to the emergency department if you develop chest pain, headache, numbness tingling or weakness, changes in mental status, or any other symptoms concerning to you.

## 2015-06-03 NOTE — ED Provider Notes (Signed)
[facility] Emergency Department Provider Note  ____________________________________________  Time seen: Approximately 3:37 PM  I have reviewed the triage vital signs and the nursing notes.   HISTORY  Chief Complaint Weakness    HPI Kristen Church is a 82 y.o. female who lives in independent living at [facility] with a history of hypertension, HL, renal artery stenosis presenting with generalized weakness. Patient reports that she has "been feeling bad" for the past 2-3 days. She denies any other symptoms including headache, visual changes, confusion, numbness tingling or weakness, nausea or vomiting, diarrhea, abdominal pain, chest pain, shortness of breath, cold or cough symptoms, any change in her medications, or trauma.   Past Medical History  Diagnosis Date  . Hypertension   . PMR (polymyalgia rheumatica) (HCC)   . PVD (peripheral vascular disease) ([place])   . Renal artery stenosis ([place])   . Osteoarthritis   . HLD (hyperlipidemia)   . Hypothyroidism   . Osteoporosis, post-menopausal     Patient Active Problem List   Diagnosis Date Noted  . Dehydration 01/20/2015  . Sigmoid diverticulitis 01/19/2015  . Pancreatic cyst 01/19/2015  . HTN (hypertension) 01/19/2015  . PMR (polymyalgia rheumatica) (HCC) 01/19/2015  . Hypothyroidism 01/19/2015  . Osteoarthritis 01/19/2015  . Right lower quadrant abdominal pain     Past Surgical History  Procedure Laterality Date  . Tonsillectomy    . Elbow surgery Left   . Breast biopsy Right     benign  . Joint replacement Right     1995  . Joint replacement Left     2005  . Temporal artery biopsy / ligation      Current Outpatient Rx  Name  Route  Sig  Dispense  Refill  . alendronate (FOSAMAX) 70 MG tablet   Oral   Take 70 mg by mouth once a week.       1   . amLODipine-benazepril (LOTREL) 5-20 MG per capsule   Oral   Take 1 capsule by mouth daily.      1   . BYSTOLIC 10 MG tablet   Oral    Take 10 mg by mouth daily.       0     Dispense as written.   . folic acid (FOLVITE) 1 MG tablet   Oral   Take 1 mg by mouth daily.       1   . hydroxychloroquine (PLAQUENIL) 200 MG tablet   Oral   Take 200 mg by mouth daily.       0   . levothyroxine (SYNTHROID, LEVOTHROID) 75 MCG tablet   Oral   Take 75 mcg by mouth daily before breakfast.       0   . lidocaine (LIDODERM) 5 %   Transdermal   Place 1 patch onto the skin daily.       0   . methotrexate (RHEUMATREX) 2.5 MG tablet      take 6 tablets by mouth EVERY WEEK         . pramipexole (MIRAPEX) 0.25 MG tablet            0   . predniSONE (DELTASONE) 5 MG tablet   Oral   Take 2.5 mg by mouth daily.      0   . RA BACITRACIN ointment   Topical   Apply topically 2 (two) times daily.      0     Dispense as written.   . senna (SENOKOT) 8.6 MG  tablet   Oral   Take by mouth.         . traMADol (ULTRAM) 50 MG tablet   Oral   Take 2 tablets by mouth every 6 (six) hours.      0     Allergies Celebrex; Macrobid; Neurontin; and Sulfa antibiotics  Family History  Problem Relation Age of Onset  . CAD Mother   . Stroke Mother   . Peripheral vascular disease Father   . Ulcers Father   . Emphysema Father   . Cancer Neg Hx   . Diabetes Neg Hx   . Ovarian cancer Neg Hx     Social History Social History  Substance Use Topics  . Smoking status: Never Smoker   . Smokeless tobacco: None  . Alcohol Use: No    Review of Systems Constitutional: No fever/chills. Positive generalized weakness. Negative lightheadedness or syncope. Eyes: No visual changes. No blurred or double vision. ENT: No sore throat. Cardiovascular: Denies chest pain, palpitations. Respiratory: Denies shortness of breath.  No cough. Gastrointestinal: No abdominal pain.  No nausea, no vomiting.  No diarrhea.  No constipation. Genitourinary: Negative for dysuria. No urinary frequency. Musculoskeletal: Negative for back  pain. Skin: Negative for rash. Neurological: Negative for headaches, focal weakness or numbness. No gait instability. No tingling.  10-point ROS otherwise negative.  ____________________________________________   PHYSICAL EXAM:  VITAL SIGNS: ED Triage Vitals  Enc Vitals Group     BP 06/03/15 1230 219/63 mmHg     Pulse Rate 06/03/15 1219 71     Resp 06/03/15 1219 11     Temp 06/03/15 1219 98.1 F (36.7 C)     Temp Source 06/03/15 1219 Oral     SpO2 06/03/15 1219 100 %     Weight 06/03/15 1230 92 lb (41.731 kg)     Height 06/03/15 1230 4\' 9"  (1.448 m)     Head Cir --      Peak Flow --      Pain Score --      Pain Loc --      Pain Edu? --      Excl. in [place]? --     Constitutional: Alert and oriented. Well appearing and in no acute distress. Answer question appropriately. Eyes: Conjunctivae are normal.  EOMI. PERRLA. Head: Atraumatic. Nose: No congestion/rhinnorhea. Mouth/Throat: Mucous membranes are moist.  Neck: No stridor.  Supple.  No JVD. Cardiovascular: Normal rate, regular rhythm. No murmurs, rubs or gallops.  Respiratory: Normal respiratory effort.  No retractions. Lungs CTAB.  No wheezes, rales or ronchi. Gastrointestinal: Soft and nontender. No distention. No peritoneal signs. Musculoskeletal: No LE edema.  Neurologic: Alert and oriented 3. Speech is clear.  Face and smile symmetric. Tongue is midline. No pronator drift. 5 out of 5 grip, biceps, triceps, hip flexors, plantar flexion and dorsiflexion. Normal sensation to light touch in the bilateral upper and lower extremities, and face.  Skin:  Skin is warm, dry and intact. No rash noted. Psychiatric: Mood and affect are normal. Speech and behavior are normal.  Normal judgement.  ____________________________________________   LABS (all labs ordered are listed, but only abnormal results are displayed)  Labs Reviewed  CBC - Abnormal; Notable for the following:    RBC 3.73 (*)    Hemoglobin 9.6 (*)    HCT 29.7  (*)    MCV 79.6 (*)    MCH 25.8 (*)    RDW 22.6 (*)    All other components within normal limits  URINALYSIS COMPLETEWITH MICROSCOPIC ([facility] ONLY) - Abnormal; Notable for the following:    Color, Urine STRAW (*)    APPearance CLEAR (*)    Squamous Epithelial / LPF 0-5 (*)    All other components within normal limits  BASIC METABOLIC PANEL - Abnormal; Notable for the following:    Sodium 131 (*)    Chloride 97 (*)    Glucose, Bld 109 (*)    Creatinine, Ser 1.09 (*)    GFR calc non Af Amer 43 (*)    GFR calc Af Amer 50 (*)    All other components within normal limits   ____________________________________________  EKG  ED ECG REPORT I, Eula Listen, the attending physician, personally viewed and interpreted this ECG.   Date: 06/03/2015  EKG Time: 1222  Rate: 72  Rhythm: normal sinus rhythm  Axis: Normal  Intervals:none  ST&T Change: Nonspecific T-wave inversions in V1 and V2.  ____________________________________________  RADIOLOGY  No results found.  ____________________________________________   PROCEDURES  Procedure(s) performed: None  Critical Care performed: No ____________________________________________   INITIAL IMPRESSION / ASSESSMENT AND PLAN / ED COURSE  Pertinent labs & imaging results that were available during my care of the patient were reviewed by me and considered in my medical decision making (see chart for details).  82 y.o. female with a history of hypertension and no recent changes in her blood pressure medications presenting with generalized weakness and a blood pressure in the 220s over 50s and 60s. The patient is otherwise asymptomatic and does not have any signs of intracranial bleeding, stroke or heart attack. I will give her medication to decrease her blood pressure and see if her symptoms improve. We'll also check basic labs including end organ dysfunction and urinary tract  infection.  ----------------------------------------- 3:42 PM on 06/03/2015 -----------------------------------------  The patient has no evidence of urinary tract infection. Her anemia is baseline without any significant change. This may be one possible reason for her generalized weakness although does not require transfusion emergently. I am awaiting her basic metabolic panel and anticipate discharge home.  Repeat BP 199/67 with HR 74.  It is possible that the patient's increased blood pressure is due to repeat her worsening renal artery stenosis which she has had in the past. I will call her primary care physician to make sure that this is followed up.  ----------------------------------------- 4:24 PM on 06/03/2015 -----------------------------------------  The patient's creatinine is within normal limits, she has some mild hyponatremia and is asymptomatic from this. I am still concerned that her renal artery stenosis may be driving her hypertension, but at this time she does not have any acute emergent condition that warranted further imaging, or mission to the hospital. I have tried to contact her PCP twice and unfortunately have not received a call back but I have let the patient know that it is imperative that she see her primary care doctor within 1-2 days. We have discussed return precautions as well as follow-up instructions. Plan discharge. ____________________________________________  FINAL CLINICAL IMPRESSION(S) / ED DIAGNOSES  Final diagnoses:  Hyponatremia  Generalized weakness  Essential hypertension      NEW MEDICATIONS STARTED DURING THIS VISIT:  New Prescriptions   No medications on file     Eula Listen, MD 06/03/15 1625

## 2015-06-16 ENCOUNTER — Emergency Department
Admission: EM | Admit: 2015-06-16 | Discharge: 2015-06-16 | Disposition: A | Discharge status: Home / Self Care | Payer: Medicare Other | Attending: Emergency Medicine | Admitting: Emergency Medicine

## 2015-06-16 ENCOUNTER — Encounter: Payer: Self-pay | Admitting: Intensive Care

## 2015-06-16 DIAGNOSIS — R309 Painful micturition, unspecified: Secondary | ICD-10-CM | POA: Diagnosis present

## 2015-06-16 DIAGNOSIS — I159 Secondary hypertension, unspecified: Secondary | ICD-10-CM | POA: Insufficient documentation

## 2015-06-16 DIAGNOSIS — R300 Dysuria: Secondary | ICD-10-CM | POA: Diagnosis not present

## 2015-06-16 DIAGNOSIS — I10 Essential (primary) hypertension: Secondary | ICD-10-CM | POA: Diagnosis not present

## 2015-06-16 DIAGNOSIS — Z008 Encounter for other general examination: Secondary | ICD-10-CM | POA: Insufficient documentation

## 2015-06-16 DIAGNOSIS — Z139 Encounter for screening, unspecified: Secondary | ICD-10-CM

## 2015-06-16 LAB — URINALYSIS COMPLETE WITH MICROSCOPIC (ARMC ONLY)
Bacteria, UA: NONE SEEN
Bilirubin Urine: NEGATIVE
Glucose, UA: NEGATIVE mg/dL
HGB URINE DIPSTICK: NEGATIVE
KETONES UR: NEGATIVE mg/dL
NITRITE: NEGATIVE
PH: 6 (ref 5.0–8.0)
PROTEIN: NEGATIVE mg/dL
SPECIFIC GRAVITY, URINE: 1.01 (ref 1.005–1.030)
Squamous Epithelial / LPF: NONE SEEN

## 2015-06-16 LAB — COMPREHENSIVE METABOLIC PANEL
ALK PHOS: 35 U/L — AB (ref 38–126)
ALT: 10 U/L — AB (ref 14–54)
AST: 20 U/L (ref 15–41)
Albumin: 3.7 g/dL (ref 3.5–5.0)
Anion gap: 7 (ref 5–15)
BILIRUBIN TOTAL: 0.7 mg/dL (ref 0.3–1.2)
BUN: 24 mg/dL — AB (ref 6–20)
CHLORIDE: 102 mmol/L (ref 101–111)
CO2: 25 mmol/L (ref 22–32)
CREATININE: 1.02 mg/dL — AB (ref 0.44–1.00)
Calcium: 8.9 mg/dL (ref 8.9–10.3)
GFR, EST AFRICAN AMERICAN: 54 mL/min — AB (ref >60)
GFR, EST NON AFRICAN AMERICAN: 47 mL/min — AB (ref >60)
Glucose, Bld: 81 mg/dL (ref 65–99)
Potassium: 4 mmol/L (ref 3.5–5.1)
Sodium: 134 mmol/L — ABNORMAL LOW (ref 135–145)
TOTAL PROTEIN: 6.3 g/dL — AB (ref 6.5–8.1)

## 2015-06-16 LAB — CBC
HEMATOCRIT: 27.2 % — AB (ref 35.0–47.0)
Hemoglobin: 8.4 g/dL — ABNORMAL LOW (ref 12.0–16.0)
MCH: 25.3 pg — ABNORMAL LOW (ref 26.0–34.0)
MCHC: 30.9 g/dL — AB (ref 32.0–36.0)
MCV: 81.7 fL (ref 80.0–100.0)
PLATELETS: 440 10*3/uL (ref 150–440)
RBC: 3.33 MIL/uL — ABNORMAL LOW (ref 3.80–5.20)
RDW: 21.1 % — AB (ref 11.5–14.5)
WBC: 6.4 10*3/uL (ref 3.6–11.0)

## 2015-06-16 LAB — LIPASE, BLOOD: LIPASE: 31 U/L (ref 11–51)

## 2015-06-16 MED ORDER — AMLODIPINE BESYLATE 5 MG PO TABS
5.0000 mg | ORAL_TABLET | ORAL | Status: AC
  Administered 2015-06-16: 5 mg via ORAL
  Filled 2015-06-16: qty 1

## 2015-06-16 MED ORDER — BENAZEPRIL HCL 20 MG PO TABS
20.0000 mg | ORAL_TABLET | ORAL | Status: AC
  Administered 2015-06-16: 20 mg via ORAL
  Filled 2015-06-16: qty 1

## 2015-06-16 NOTE — Discharge Instructions (Signed)
Blood pressure is again noted to be quite high. We discussed, and please have the nurse in assisted living check your blood pressure again tomorrow. Please call your primary doctor for close follow-up, and continue your normal blood pressure medicines. Return emergency right away if he develops severe headache, chest pain, trouble breathing, have numbness or weakness, or other new concerns arise.  Your test today do not demonstrate any clear cause for the discomfort you have noticed while urinating. There is no sign of infection presently, but we did send this for culture and UA receive a phone call should it show any evidence of infection.  Please stay hydrated, return to the emergency room right away if he develop a fever, chills, vomiting, weakness, trouble eating, abdominal pain, worsening symptoms or new concerns arise.

## 2015-06-16 NOTE — ED Notes (Signed)
PAtient arrived by EMS from home c/o burning upon urination. Patient states "I wanted to get checked out"

## 2015-06-16 NOTE — ED Provider Notes (Signed)
[facility] Emergency Department Provider Note  ____________________________________________  Time seen: Approximately 5:16 PM  I have reviewed the triage vital signs and the nursing notes.   HISTORY  Chief Complaint Urinary Tract Infection    HPI Kristen Church is a 82 y.o. female to previous history of polymyalgia, peripheral or disease, renal artery stenosis.  Patient improves today notes that she's had burning with urination starting last night. No nausea vomiting fevers or chills. Denies abdominal pain. She called the nurse who advised her to seek further treatment. As she was not able to arrange a doctor's appointment because of the hour of the day she requests to come in the ER for further evaluation.  No chest pain or trouble breathing. I will walk denies any weakness.   Past Medical History  Diagnosis Date  . Hypertension   . PMR (polymyalgia rheumatica) (HCC)   . PVD (peripheral vascular disease) ([place])   . Renal artery stenosis ([place])   . Osteoarthritis   . HLD (hyperlipidemia)   . Hypothyroidism   . Osteoporosis, post-menopausal     Patient Active Problem List   Diagnosis Date Noted  . Dehydration 01/20/2015  . Sigmoid diverticulitis 01/19/2015  . Pancreatic cyst 01/19/2015  . HTN (hypertension) 01/19/2015  . PMR (polymyalgia rheumatica) (HCC) 01/19/2015  . Hypothyroidism 01/19/2015  . Osteoarthritis 01/19/2015  . Right lower quadrant abdominal pain     Past Surgical History  Procedure Laterality Date  . Tonsillectomy    . Elbow surgery Left   . Breast biopsy Right     benign  . Joint replacement Right     1995  . Joint replacement Left     2005  . Temporal artery biopsy / ligation      Current Outpatient Rx  Name  Route  Sig  Dispense  Refill  . alendronate (FOSAMAX) 70 MG tablet   Oral   Take 70 mg by mouth once a week.       1   . amLODipine-benazepril (LOTREL) 5-20 MG per capsule   Oral   Take 1 capsule by mouth  daily.      1   . BYSTOLIC 10 MG tablet   Oral   Take 10 mg by mouth daily.       0     Dispense as written.   . folic acid (FOLVITE) 1 MG tablet   Oral   Take 1 mg by mouth daily.       1   . hydroxychloroquine (PLAQUENIL) 200 MG tablet   Oral   Take 200 mg by mouth daily.       0   . levothyroxine (SYNTHROID, LEVOTHROID) 75 MCG tablet   Oral   Take 75 mcg by mouth daily before breakfast.       0   . lidocaine (LIDODERM) 5 %   Transdermal   Place 1 patch onto the skin daily.       0   . methotrexate (RHEUMATREX) 2.5 MG tablet      take 6 tablets by mouth EVERY WEEK         . pramipexole (MIRAPEX) 0.25 MG tablet            0   . predniSONE (DELTASONE) 5 MG tablet   Oral   Take 2.5 mg by mouth daily.      0   . RA BACITRACIN ointment   Topical   Apply topically 2 (two) times daily.  0     Dispense as written.   . senna (SENOKOT) 8.6 MG tablet   Oral   Take by mouth.         . traMADol (ULTRAM) 50 MG tablet   Oral   Take 2 tablets by mouth every 6 (six) hours.      0     Allergies Celebrex; Macrobid; Neurontin; and Sulfa antibiotics  Family History  Problem Relation Age of Onset  . CAD Mother   . Stroke Mother   . Peripheral vascular disease Father   . Ulcers Father   . Emphysema Father   . Cancer Neg Hx   . Diabetes Neg Hx   . Ovarian cancer Neg Hx     Social History Social History  Substance Use Topics  . Smoking status: Never Smoker   . Smokeless tobacco: None  . Alcohol Use: No    Review of Systems Constitutional: No fever/chills Eyes: No visual changes. ENT: No sore throat. Cardiovascular: Denies chest pain. Respiratory: Denies shortness of breath. Gastrointestinal: No abdominal pain.  No nausea, no vomiting.  No diarrhea.  No constipation. Genitourinary: Burning with urination Musculoskeletal: Negative for back pain. Skin: Negative for rash. Neurological: Negative for headaches, focal weakness or  numbness.  10-point ROS otherwise negative.  ____________________________________________   PHYSICAL EXAM:  VITAL SIGNS: ED Triage Vitals  Enc Vitals Group     BP 06/16/15 1704 223/56 mmHg     Pulse Rate 06/16/15 1704 69     Resp 06/16/15 1704 14     Temp 06/16/15 1704 97.9 F (36.6 C)     Temp Source 06/16/15 1704 Oral     SpO2 06/16/15 1704 100 %     Weight 06/16/15 1704 93 lb (42.185 kg)     Height 06/16/15 1704 4\' 9"  (1.448 m)     Head Cir --      Peak Flow --      Pain Score --      Pain Loc --      Pain Edu? --      Excl. in [place]? --    Constitutional: Alert and oriented. Well appearing and in no acute distress. Eyes: Conjunctivae are normal. PERRL. EOMI. Head: Atraumatic. Nose: No congestion/rhinnorhea. Mouth/Throat: Mucous membranes are somewhat dry.  Oropharynx non-erythematous. Neck: No stridor.   Cardiovascular: Normal rate, regular rhythm. Grossly normal heart sounds.  Good peripheral circulation. Respiratory: Normal respiratory effort.  No retractions. Lungs CTAB. Gastrointestinal: Soft and nontender. No distention. No abdominal bruits. No CVA tenderness. Musculoskeletal: No lower extremity tenderness nor edema.  No joint effusions. Neurologic:  Normal speech and language. No gross focal neurologic deficits are appreciated. No gait instability. Skin:  Skin is warm, dry and intact. No rash noted. Psychiatric: Mood and affect are normal. Speech and behavior are normal.  ____________________________________________   LABS (all labs ordered are listed, but only abnormal results are displayed)  Labs Reviewed  URINALYSIS COMPLETEWITH MICROSCOPIC ([facility] ONLY) - Abnormal; Notable for the following:    Color, Urine YELLOW (*)    APPearance CLEAR (*)    Leukocytes, UA TRACE (*)    All other components within normal limits  CBC - Abnormal; Notable for the following:    RBC 3.33 (*)    Hemoglobin 8.4 (*)    HCT 27.2 (*)    MCH 25.3 (*)    MCHC 30.9 (*)    RDW  21.1 (*)    All other components within normal limits  COMPREHENSIVE METABOLIC  PANEL - Abnormal; Notable for the following:    Sodium 134 (*)    BUN 24 (*)    Creatinine, Ser 1.02 (*)    Total Protein 6.3 (*)    ALT 10 (*)    Alkaline Phosphatase 35 (*)    GFR calc non Af Amer 47 (*)    GFR calc Af Amer 54 (*)    All other components within normal limits  URINE CULTURE  URINE CULTURE  LIPASE, BLOOD   ____________________________________________  EKG   ____________________________________________  RADIOLOGY   ____________________________________________   PROCEDURES  Procedure(s) performed: None  Critical Care performed: No  ____________________________________________   INITIAL IMPRESSION / ASSESSMENT AND PLAN / ED COURSE  Pertinent labs & imaging results that were available during my care of the patient were reviewed by me and considered in my medical decision making (see chart for details).  Patient presents for concerns of mild dysuria. She is afebrile with stable hemodynamics except for noted hypertension for which she states she did not take her blood pressure medicine today.  She denies any nausea vomiting fevers chills or other systemic symptoms.  Urinalysis does not indicate infection, but given her concern of dysuria that has been sent for a culture.  ----------------------------------------- 6:03 PM on 06/16/2015 -----------------------------------------  Patient alert and awake, no distress. Taking by mouth. Patient requesting discharge to home. Discussed and reviewed her labs, and she'll follow up closely with nursing staff at [facility] as well as her primary care doctor. Careful return precautions advised which she is agreeable.  Return precautions and treatment recommendations and follow-up discussed with the patient who is agreeable with the plan.  Patient had not taken her blood pressure medicine home. She has a documented history of  hypertension, was notably quite hypertensive and her last ER visit as well. We'll give her home medications and observed. She has no signs or symptoms suggest hypertensive urgency at this time.  Patient's blood pressure remains elevated after treatment with home meds. Discussed with the patient, she indicates that she would like to go home and I think this is reasonable as she has no evidence of hypertensive urgency and seems to have a history of hypertension. I did encourage her to have the nurse at her facility check her blood pressure daily, and for her to call her primary care doctor for follow-up. She will continue on her normal home blood pressure medications and close return precautions were advised. ____________________________________________   FINAL CLINICAL IMPRESSION(S) / ED DIAGNOSES  Final diagnoses:  Encounter for medical screening examination  Dysuria  Essential hypertension      Delman Kitten, MD 06/16/15 2005

## 2015-06-18 LAB — URINE CULTURE

## 2015-11-18 ENCOUNTER — Emergency Department: Payer: Medicare Other

## 2015-11-18 ENCOUNTER — Inpatient Hospital Stay: Payer: Medicare Other

## 2015-11-18 ENCOUNTER — Inpatient Hospital Stay
Admission: EM | Admit: 2015-11-18 | Discharge: 2015-11-20 | DRG: 948 | Disposition: A | Discharge status: Skilled Nursing Facility | Payer: Medicare Other | Attending: Internal Medicine | Admitting: Internal Medicine

## 2015-11-18 ENCOUNTER — Encounter: Payer: Self-pay | Admitting: Emergency Medicine

## 2015-11-18 DIAGNOSIS — E039 Hypothyroidism, unspecified: Secondary | ICD-10-CM | POA: Diagnosis present

## 2015-11-18 DIAGNOSIS — M353 Polymyalgia rheumatica: Secondary | ICD-10-CM | POA: Diagnosis present

## 2015-11-18 DIAGNOSIS — R531 Weakness: Secondary | ICD-10-CM | POA: Diagnosis present

## 2015-11-18 DIAGNOSIS — Z825 Family history of asthma and other chronic lower respiratory diseases: Secondary | ICD-10-CM

## 2015-11-18 DIAGNOSIS — Z79899 Other long term (current) drug therapy: Secondary | ICD-10-CM

## 2015-11-18 DIAGNOSIS — Z9889 Other specified postprocedural states: Secondary | ICD-10-CM | POA: Diagnosis not present

## 2015-11-18 DIAGNOSIS — I739 Peripheral vascular disease, unspecified: Secondary | ICD-10-CM | POA: Diagnosis present

## 2015-11-18 DIAGNOSIS — I701 Atherosclerosis of renal artery: Secondary | ICD-10-CM | POA: Diagnosis present

## 2015-11-18 DIAGNOSIS — F0390 Unspecified dementia without behavioral disturbance: Secondary | ICD-10-CM | POA: Diagnosis present

## 2015-11-18 DIAGNOSIS — E785 Hyperlipidemia, unspecified: Secondary | ICD-10-CM | POA: Diagnosis present

## 2015-11-18 DIAGNOSIS — I10 Essential (primary) hypertension: Secondary | ICD-10-CM

## 2015-11-18 DIAGNOSIS — G2581 Restless legs syndrome: Secondary | ICD-10-CM | POA: Diagnosis present

## 2015-11-18 DIAGNOSIS — Z823 Family history of stroke: Secondary | ICD-10-CM

## 2015-11-18 DIAGNOSIS — Z888 Allergy status to other drugs, medicaments and biological substances status: Secondary | ICD-10-CM

## 2015-11-18 DIAGNOSIS — Z882 Allergy status to sulfonamides status: Secondary | ICD-10-CM | POA: Diagnosis not present

## 2015-11-18 DIAGNOSIS — Z9660 Presence of unspecified orthopedic joint implant: Secondary | ICD-10-CM | POA: Diagnosis present

## 2015-11-18 DIAGNOSIS — M810 Age-related osteoporosis without current pathological fracture: Secondary | ICD-10-CM | POA: Diagnosis present

## 2015-11-18 DIAGNOSIS — J189 Pneumonia, unspecified organism: Secondary | ICD-10-CM | POA: Diagnosis present

## 2015-11-18 DIAGNOSIS — Z8249 Family history of ischemic heart disease and other diseases of the circulatory system: Secondary | ICD-10-CM

## 2015-11-18 LAB — COMPREHENSIVE METABOLIC PANEL
ALK PHOS: 50 U/L (ref 38–126)
ALT: 17 U/L (ref 14–54)
AST: 26 U/L (ref 15–41)
Albumin: 3.6 g/dL (ref 3.5–5.0)
Anion gap: 7 (ref 5–15)
BUN: 25 mg/dL — AB (ref 6–20)
CALCIUM: 8.9 mg/dL (ref 8.9–10.3)
CHLORIDE: 103 mmol/L (ref 101–111)
CO2: 25 mmol/L (ref 22–32)
CREATININE: 1 mg/dL (ref 0.44–1.00)
GFR calc non Af Amer: 48 mL/min — ABNORMAL LOW (ref >60)
GFR, EST AFRICAN AMERICAN: 55 mL/min — AB (ref >60)
Glucose, Bld: 107 mg/dL — ABNORMAL HIGH (ref 65–99)
Potassium: 4 mmol/L (ref 3.5–5.1)
SODIUM: 135 mmol/L (ref 135–145)
Total Bilirubin: 0.5 mg/dL (ref 0.3–1.2)
Total Protein: 6.3 g/dL — ABNORMAL LOW (ref 6.5–8.1)

## 2015-11-18 LAB — URINALYSIS COMPLETE WITH MICROSCOPIC (ARMC ONLY)
BACTERIA UA: NONE SEEN
Bilirubin Urine: NEGATIVE
GLUCOSE, UA: NEGATIVE mg/dL
Hgb urine dipstick: NEGATIVE
Ketones, ur: NEGATIVE mg/dL
Leukocytes, UA: NEGATIVE
Nitrite: NEGATIVE
PROTEIN: NEGATIVE mg/dL
SPECIFIC GRAVITY, URINE: 1.009 (ref 1.005–1.030)
SQUAMOUS EPITHELIAL / LPF: NONE SEEN
pH: 6 (ref 5.0–8.0)

## 2015-11-18 LAB — CBC WITH DIFFERENTIAL/PLATELET
BASOS PCT: 1 %
Basophils Absolute: 0.1 10*3/uL (ref 0–0.1)
EOS ABS: 0 10*3/uL (ref 0–0.7)
Eosinophils Relative: 0 %
HCT: 28.4 % — ABNORMAL LOW (ref 35.0–47.0)
HEMOGLOBIN: 9.1 g/dL — AB (ref 12.0–16.0)
LYMPHS ABS: 0.8 10*3/uL — AB (ref 1.0–3.6)
Lymphocytes Relative: 10 %
MCH: 25.8 pg — AB (ref 26.0–34.0)
MCHC: 31.9 g/dL — ABNORMAL LOW (ref 32.0–36.0)
MCV: 80.9 fL (ref 80.0–100.0)
MONO ABS: 0.6 10*3/uL (ref 0.2–0.9)
MONOS PCT: 7 %
NEUTROS PCT: 82 %
Neutro Abs: 6.5 10*3/uL (ref 1.4–6.5)
PLATELETS: 385 10*3/uL (ref 150–440)
RBC: 3.51 MIL/uL — AB (ref 3.80–5.20)
RDW: 22.2 % — ABNORMAL HIGH (ref 11.5–14.5)
WBC: 8 10*3/uL (ref 3.6–11.0)

## 2015-11-18 LAB — TROPONIN I: Troponin I: <0.03 ng/mL (ref <0.03)

## 2015-11-18 MED ORDER — FOLIC ACID 1 MG PO TABS
1.0000 mg | ORAL_TABLET | Freq: Every day | ORAL | Status: DC
  Administered 2015-11-19 – 2015-11-20 (×2): 1 mg via ORAL
  Filled 2015-11-18 (×2): qty 1

## 2015-11-18 MED ORDER — SODIUM CHLORIDE 0.9% FLUSH: 3.0000 mL | INTRAVENOUS | Status: DC | PRN

## 2015-11-18 MED ORDER — LEVOTHYROXINE SODIUM 88 MCG PO TABS
88.0000 ug | ORAL_TABLET | Freq: Every day | ORAL | Status: DC
Start: 2015-11-19 — End: 2015-11-19
  Administered 2015-11-19: 88 ug via ORAL
  Filled 2015-11-18: qty 1

## 2015-11-18 MED ORDER — PRAMIPEXOLE DIHYDROCHLORIDE 0.25 MG PO TABS
0.2500 mg | ORAL_TABLET | Freq: Every day | ORAL | Status: DC
  Administered 2015-11-18 – 2015-11-19 (×2): 0.25 mg via ORAL
  Filled 2015-11-18 (×2): qty 1

## 2015-11-18 MED ORDER — ONDANSETRON HCL 4 MG PO TABS: 4.0000 mg | ORAL_TABLET | Freq: Four times a day (QID) | ORAL | Status: DC | PRN

## 2015-11-18 MED ORDER — METHOTREXATE 2.5 MG PO TABS
15.0000 mg | ORAL_TABLET | ORAL | Status: DC
  Administered 2015-11-20: 15 mg via ORAL
  Filled 2015-11-18: qty 6

## 2015-11-18 MED ORDER — ENOXAPARIN SODIUM 30 MG/0.3ML ~~LOC~~ SOLN
30.0000 mg | SUBCUTANEOUS | Status: DC
Start: 2015-11-18 — End: 2015-11-20
  Administered 2015-11-18 – 2015-11-19 (×2): 30 mg via SUBCUTANEOUS
  Filled 2015-11-18 (×2): qty 0.3

## 2015-11-18 MED ORDER — ACETAMINOPHEN 325 MG PO TABS
650.0000 mg | ORAL_TABLET | Freq: Four times a day (QID) | ORAL | Status: DC | PRN
  Administered 2015-11-19: 650 mg via ORAL
  Filled 2015-11-18: qty 2

## 2015-11-18 MED ORDER — SODIUM CHLORIDE 0.9 % IV SOLN: 250.0000 mL | INTRAVENOUS | Status: DC | PRN

## 2015-11-18 MED ORDER — LORAZEPAM 2 MG/ML IJ SOLN
0.5000 mg | Freq: Once | INTRAMUSCULAR | Status: DC
  Filled 2015-11-18: qty 1

## 2015-11-18 MED ORDER — ACETAMINOPHEN 650 MG RE SUPP: 650.0000 mg | Freq: Four times a day (QID) | RECTAL | Status: DC | PRN

## 2015-11-18 MED ORDER — PREDNISONE 2.5 MG PO TABS
2.5000 mg | ORAL_TABLET | ORAL | Status: DC
  Administered 2015-11-18 – 2015-11-20 (×2): 2.5 mg via ORAL
  Filled 2015-11-18 (×2): qty 1

## 2015-11-18 MED ORDER — HYDROXYCHLOROQUINE SULFATE 200 MG PO TABS
400.0000 mg | ORAL_TABLET | Freq: Every day | ORAL | Status: DC
  Administered 2015-11-18 – 2015-11-20 (×3): 400 mg via ORAL
  Filled 2015-11-18 (×3): qty 2

## 2015-11-18 MED ORDER — FERROUS SULFATE 325 (65 FE) MG PO TABS
325.0000 mg | ORAL_TABLET | Freq: Every day | ORAL | Status: DC
  Administered 2015-11-19 – 2015-11-20 (×2): 325 mg via ORAL
  Filled 2015-11-18 (×2): qty 1

## 2015-11-18 MED ORDER — SODIUM CHLORIDE 0.9% FLUSH
3.0000 mL | Freq: Two times a day (BID) | INTRAVENOUS | Status: DC
  Administered 2015-11-19 – 2015-11-20 (×2): 3 mL via INTRAVENOUS

## 2015-11-18 MED ORDER — NEBIVOLOL HCL 5 MG PO TABS
5.0000 mg | ORAL_TABLET | Freq: Every day | ORAL | Status: DC
Start: 2015-11-18 — End: 2015-11-20
  Administered 2015-11-18 – 2015-11-20 (×3): 5 mg via ORAL
  Filled 2015-11-18 (×3): qty 1

## 2015-11-18 MED ORDER — DEXTROSE 5 % IV SOLN
500.0000 mg | Freq: Once | INTRAVENOUS | Status: AC
  Administered 2015-11-18: 500 mg via INTRAVENOUS
  Filled 2015-11-18: qty 500

## 2015-11-18 MED ORDER — TRAZODONE HCL 50 MG PO TABS
50.0000 mg | ORAL_TABLET | Freq: Every day | ORAL | Status: DC
  Administered 2015-11-18 – 2015-11-19 (×2): 50 mg via ORAL
  Filled 2015-11-18 (×2): qty 1

## 2015-11-18 MED ORDER — HYDRALAZINE HCL 20 MG/ML IJ SOLN
10.0000 mg | INTRAMUSCULAR | Status: DC | PRN
  Administered 2015-11-18 – 2015-11-19 (×3): 10 mg via INTRAVENOUS
  Filled 2015-11-18 (×5): qty 1

## 2015-11-18 MED ORDER — LABETALOL HCL 5 MG/ML IV SOLN
10.0000 mg | Freq: Once | INTRAVENOUS | Status: AC
  Administered 2015-11-18: 10 mg via INTRAVENOUS
  Filled 2015-11-18: qty 4

## 2015-11-18 MED ORDER — SODIUM CHLORIDE 0.9% FLUSH
3.0000 mL | Freq: Two times a day (BID) | INTRAVENOUS | Status: DC
  Administered 2015-11-18 – 2015-11-20 (×4): 3 mL via INTRAVENOUS

## 2015-11-18 MED ORDER — TRAMADOL HCL 50 MG PO TABS
50.0000 mg | ORAL_TABLET | Freq: Four times a day (QID) | ORAL | Status: DC | PRN
  Administered 2015-11-18 – 2015-11-19 (×2): 50 mg via ORAL
  Filled 2015-11-18 (×2): qty 1

## 2015-11-18 MED ORDER — DEXTROSE 5 % IV SOLN
1.0000 g | Freq: Once | INTRAVENOUS | Status: AC
  Administered 2015-11-18: 1 g via INTRAVENOUS
  Filled 2015-11-18: qty 10

## 2015-11-18 MED ORDER — ONDANSETRON HCL 4 MG/2ML IJ SOLN: 4.0000 mg | Freq: Four times a day (QID) | INTRAMUSCULAR | Status: DC | PRN

## 2015-11-18 NOTE — ED Provider Notes (Signed)
[facility] Emergency Department Provider Note   ____________________________________________  Time seen: Approximately 11:23 AM  I have reviewed the triage vital signs and the nursing notes.   HISTORY  Chief Complaint Altered Mental Status    HPI Kristen Church is a 82 y.o. female with history of hypertension, dementia, chronic pain, rheumatoid and osteoarthritis, restless leg syndrome, osteoporosis presents from her living facility for evaluation of generalized weakness which began last night, constant, moderate, no modifying factors. Per the patient and EMS, the patient has had generalized weakness and some increase in her confusion since last night. On EMS arrival, her vital signs were stable, she had negative stroke screen. Patient denies any chest pain, difficulty breathing, she denies any recent illness and really no vomiting, diarrhea, fevers or chills. She is complaining of right shoulder pain which is worse with movement however reports that that has been ongoing for several days, she denies any falls or trauma.   Past Medical History  Diagnosis Date  . Hypertension   . PMR (polymyalgia rheumatica) (HCC)   . PVD (peripheral vascular disease) ([place])   . Renal artery stenosis ([place])   . Osteoarthritis   . HLD (hyperlipidemia)   . Hypothyroidism   . Osteoporosis, post-menopausal     Patient Active Problem List   Diagnosis Date Noted  . General weakness 11/18/2015  . Malignant essential hypertension 11/18/2015  . Pneumonia 11/18/2015  . Dehydration 01/20/2015  . Sigmoid diverticulitis 01/19/2015  . Pancreatic cyst 01/19/2015  . HTN (hypertension) 01/19/2015  . PMR (polymyalgia rheumatica) (HCC) 01/19/2015  . Hypothyroidism 01/19/2015  . Osteoarthritis 01/19/2015  . Right lower quadrant abdominal pain     Past Surgical History  Procedure Laterality Date  . Tonsillectomy    . Elbow surgery Left   . Breast biopsy Right     benign  . Joint  replacement Right     1995  . Joint replacement Left     2005  . Temporal artery biopsy / ligation      Current Outpatient Rx  Name  Route  Sig  Dispense  Refill  . ferrous sulfate 325 (65 FE) MG tablet   Oral   Take 325 mg by mouth daily.         . folic acid (FOLVITE) 1 MG tablet   Oral   Take 1 mg by mouth daily.       1   . hydroxychloroquine (PLAQUENIL) 200 MG tablet   Oral   Take 400 mg by mouth daily.       0   . levothyroxine (SYNTHROID, LEVOTHROID) 88 MCG tablet   Oral   Take 88 mcg by mouth daily before breakfast.         . methotrexate (RHEUMATREX) 2.5 MG tablet      take 6 tablets by mouth EVERY WEEK         . nebivolol (BYSTOLIC) 5 MG tablet   Oral   Take 5 mg by mouth daily.         . pramipexole (MIRAPEX) 0.25 MG tablet   Oral   Take 0.25 mg by mouth at bedtime.       0   . predniSONE (DELTASONE) 5 MG tablet   Oral   Take 2.5 mg by mouth every other day.       0   . traMADol (ULTRAM) 50 MG tablet   Oral   Take 50 mg by mouth 4 (four) times daily as needed for  moderate pain or severe pain.       0   . traZODone (DESYREL) 50 MG tablet   Oral   Take 50 mg by mouth at bedtime.           Allergies Celebrex; Macrobid; Neurontin; Sulfa antibiotics; Other; and Sulfamethoxazole  Family History  Problem Relation Age of Onset  . CAD Mother   . Stroke Mother   . Peripheral vascular disease Father   . Ulcers Father   . Emphysema Father   . Cancer Neg Hx   . Diabetes Neg Hx   . Ovarian cancer Neg Hx     Social History Social History  Substance Use Topics  . Smoking status: Never Smoker   . Smokeless tobacco: None  . Alcohol Use: No    Review of Systems Constitutional: No fever/chills Eyes: No visual changes. ENT: No sore throat. Cardiovascular: Denies chest pain. Respiratory: Denies shortness of breath. Gastrointestinal: No abdominal pain.  No nausea, no vomiting.  No diarrhea.  No constipation. Genitourinary:  Negative for dysuria. Musculoskeletal: Negative for back pain. Skin: Negative for rash. Neurological: Negative for headaches, focal weakness or numbness.  10-point ROS otherwise negative.  ____________________________________________   PHYSICAL EXAM:  VITAL SIGNS: ED Triage Vitals  Enc Vitals Group     BP 11/18/15 1110 181/64 mmHg     Pulse Rate 11/18/15 1110 54     Resp 11/18/15 1110 16     Temp 11/18/15 1110 97.2 F (36.2 C)     Temp Source 11/18/15 1110 Oral     SpO2 11/18/15 1110 100 %     Weight 11/18/15 1110 98 lb 11.2 oz (44.77 kg)     Height 11/18/15 1110 5' (1.524 m)     Head Cir --      Peak Flow --      Pain Score 11/18/15 1113 8     Pain Loc --      Pain Edu? --      Excl. in [place]? --     Constitutional: Alert and oriented to self and place but not to year. Well appearing and in no acute distress. Eyes: Conjunctivae are normal. PERRL. EOMI. Head: Atraumatic. Nose: No congestion/rhinnorhea. Mouth/Throat: Mucous membranes are moist.  Oropharynx non-erythematous. Neck: No stridor.  Supple without meningismus. Cardiovascular: Normal rate, regular rhythm. Grossly normal heart sounds.  Good peripheral circulation. Respiratory: Normal respiratory effort.  No retractions. Diminished breath sounds in the left lower lung fields. No tachypnea or increased work of breathing, no wheeze or rales. Gastrointestinal: Soft and nontender. No distention.  No CVA tenderness. Genitourinary: deferred Musculoskeletal: No lower extremity tenderness nor edema.  No joint effusions. Mild tenderness of the right shoulder but the patient has full though somewhat painful range of motion at the joint. Neurologic:  Normal speech and language Without aphasia or dysarthria. No gross focal neurologic deficits are appreciated. No gait instability. 5 out of 5 strength bilateral upper and lower extremities, sensation intact to light touch throughout. Skin:  Skin is warm, dry and intact. No rash  noted. Psychiatric: Mood and affect are normal. Speech and behavior are normal.  ____________________________________________   LABS (all labs ordered are listed, but only abnormal results are displayed)  Labs Reviewed  CBC WITH DIFFERENTIAL/PLATELET - Abnormal; Notable for the following:    RBC 3.51 (*)    Hemoglobin 9.1 (*)    HCT 28.4 (*)    MCH 25.8 (*)    MCHC 31.9 (*)    RDW 22.2 (*)  Lymphs Abs 0.8 (*)    All other components within normal limits  COMPREHENSIVE METABOLIC PANEL - Abnormal; Notable for the following:    Glucose, Bld 107 (*)    BUN 25 (*)    Total Protein 6.3 (*)    GFR calc non Af Amer 48 (*)    GFR calc Af Amer 55 (*)    All other components within normal limits  URINALYSIS COMPLETEWITH MICROSCOPIC ([facility] ONLY) - Abnormal; Notable for the following:    Color, Urine STRAW (*)    APPearance CLEAR (*)    All other components within normal limits  CULTURE, BLOOD (ROUTINE X 2)  CULTURE, BLOOD (ROUTINE X 2)  CULTURE, EXPECTORATED SPUTUM-ASSESSMENT  TROPONIN I   ____________________________________________  EKG  ED ECG REPORT I, Joanne Gavel, the attending physician, personally viewed and interpreted this ECG.   Date: 11/18/2015  EKG Time: 11:11  Rate: 76  Rhythm: normal sinus rhythm  Axis: normal  Intervals:none  ST&T Change: No acute ST elevation or ST depression.  ____________________________________________  RADIOLOGY  CT head  IMPRESSION: No acute intracranial abnormality. No definite acute cortical infarction. Stable atrophy and chronic white matter disease.   CXR  IMPRESSION: 1. Left posterior lung base opacity, suspicious for pneumonia. ____________________________________________   PROCEDURES  Procedure(s) performed: None  Procedures  Critical Care performed: No  ____________________________________________   INITIAL IMPRESSION / ASSESSMENT AND PLAN / ED COURSE  Pertinent labs & imaging results that were  available during my care of the patient were reviewed by me and considered in my medical decision making (see chart for details).  Kristen Church is a 82 y.o. female with history of hypertension, dementia, chronic pain, rheumatoid and osteoarthritis, restless leg syndrome, osteoporosis presents from her living facility for evaluation of generalized weakness which began last night. On exam, she is nontoxic appearing and in no acute distress, vital signs are stable and she is afebrile. She has an intact neurological examination. Mildly decreased breath sounds in the left lung fields. We'll obtain screening labs, chest x-ray, CT head, urinalysis, reassess for disposition.  ----------------------------------------- 2:38 PM on 11/18/2015 ----------------------------------------- I reviewed the patient's labs which are notable for chronic stable anemia, hemoglobin 9.1. Unremarkable CMP, negative troponin. Urinalysis consistent with infection. Vital to the right shoulder showed no acute bony pathology, CT head with no acute intracranial abnormality. Chest x-ray shows left lung base opacity concerning for pneumonia. The patient is unable to walk secondary to generalized weakness, she lives inderendentently and cannot return to that state of living at this time. Ceftriaxone and azithromycin ordered. Case discussed with hospitalist for admission at this time.  ____________________________________________   FINAL CLINICAL IMPRESSION(S) / ED DIAGNOSES  Final diagnoses:  Weakness generalized  Community acquired pneumonia      NEW MEDICATIONS STARTED DURING THIS VISIT:  New Prescriptions   No medications on file     Note:  This document was prepared using Dragon voice recognition software and may include unintentional dictation errors.    Joanne Gavel, MD 11/18/15 [phone]

## 2015-11-18 NOTE — ED Notes (Signed)
Pt to ED via EMS from [facility] today called out for stroke like symptoms and confusion.  EMS states negative symptoms upon arrival.  Upon arrival to ED pt is A&Ox4, speaking in complete and coherent sentences, c/o right shoulder pain states is chronic with no known injury.  Pt states nurse at [facility] saw pt yesterday "and I guess she thought I needed to be here".  States hx of HTN.

## 2015-11-18 NOTE — H&P (Signed)
[facility] NAME: Kristen Church    MR#:  [id number]  DATE OF BIRTH:  03-17-1923  DATE OF ADMISSION:  11/18/2015  PRIMARY CARE PHYSICIAN: BABAOFF, Caryl Bis, MD   REQUESTING/REFERRING PHYSICIAN:   CHIEF COMPLAINT:   Chief Complaint  Patient presents with  . Altered Mental Status    HISTORY OF PRESENT ILLNESS: Kristen Church  is a 82 y.o. female with a known history of Multiple medical problems including essential hypertension, polymyalgia rheumatica, peripheral vascular disease, renal artery stenosis, osteoporosis, arthritis, hyperlipidemia, osteoporosis, who presents to the hospital with complaints of weakness, generalized in nature. The patient, she was doing well up until today morning when she woke up she noted that she is very weak and decided to come to emergency room for further evaluation. According to EMS, patient had some increasing confusion as well. She complained of right shoulder pain which was worse with movement, but denied any trauma. In emergency room, she had CT scan of the head without contrast which revealed no acute intracranial abnormality. Right shoulder x-ray showed no acute bony pathology, chest x-ray, however, revealed left posterior lung base opacity which was concerning for pneumonia. Hospitalist services were contacted for admission.   PAST MEDICAL HISTORY:   Past Medical History  Diagnosis Date  . Hypertension   . PMR (polymyalgia rheumatica) (HCC)   . PVD (peripheral vascular disease) ([place])   . Renal artery stenosis ([place])   . Osteoarthritis   . HLD (hyperlipidemia)   . Hypothyroidism   . Osteoporosis, post-menopausal     PAST SURGICAL HISTORY: Past Surgical History  Procedure Laterality Date  . Tonsillectomy    . Elbow surgery Left   . Breast biopsy Right     benign  . Joint replacement Right     1995  . Joint replacement Left     2005  . Temporal artery biopsy / ligation      SOCIAL  HISTORY:  Social History  Substance Use Topics  . Smoking status: Never Smoker   . Smokeless tobacco: Not on file  . Alcohol Use: No    FAMILY HISTORY:  Family History  Problem Relation Age of Onset  . CAD Mother   . Stroke Mother   . Peripheral vascular disease Father   . Ulcers Father   . Emphysema Father   . Cancer Neg Hx   . Diabetes Neg Hx   . Ovarian cancer Neg Hx     DRUG ALLERGIES:  Allergies  Allergen Reactions  . Celebrex [Celecoxib]     confusion  . Macrobid [Nitrofurantoin Monohyd Macro]     Stiffness, redness, hot feeling  . Neurontin [Gabapentin]     dizziness  . Sulfa Antibiotics   . Other Rash    Eggplant  . Sulfamethoxazole Rash    Review of Systems  Constitutional: Positive for malaise/fatigue. Negative for fever, chills and weight loss.  HENT: Negative for congestion.   Eyes: Negative for blurred vision and double vision.  Respiratory: Negative for cough, sputum production, shortness of breath and wheezing.   Cardiovascular: Negative for chest pain, palpitations, orthopnea, leg swelling and PND.  Gastrointestinal: Negative for nausea, vomiting, abdominal pain, diarrhea, constipation, blood in stool and melena.  Genitourinary: Negative for dysuria, urgency, frequency and hematuria.  Musculoskeletal: Positive for joint pain. Negative for falls.  Skin: Negative for rash.  Neurological: Positive for weakness. Negative for dizziness.  Psychiatric/Behavioral: Negative for depression and memory loss. The patient is  not nervous/anxious.     MEDICATIONS AT HOME:  Prior to Admission medications   Medication Sig Start Date End Date Taking? Authorizing Provider  ferrous sulfate 325 (65 FE) MG tablet Take 325 mg by mouth daily.   Yes Historical Provider, MD  folic acid (FOLVITE) 1 MG tablet Take 1 mg by mouth daily.  01/06/15  Yes Historical Provider, MD  hydroxychloroquine (PLAQUENIL) 200 MG tablet Take 400 mg by mouth daily.  11/28/14  Yes Historical  Provider, MD  levothyroxine (SYNTHROID, LEVOTHROID) 88 MCG tablet Take 88 mcg by mouth daily before breakfast.   Yes Historical Provider, MD  methotrexate (RHEUMATREX) 2.5 MG tablet take 6 tablets by mouth EVERY WEEK 03/12/15  Yes Historical Provider, MD  nebivolol (BYSTOLIC) 5 MG tablet Take 5 mg by mouth daily.   Yes Historical Provider, MD  pramipexole (MIRAPEX) 0.25 MG tablet Take 0.25 mg by mouth at bedtime.  03/17/15  Yes Historical Provider, MD  predniSONE (DELTASONE) 5 MG tablet Take 2.5 mg by mouth every other day.  12/30/14  Yes Historical Provider, MD  traMADol (ULTRAM) 50 MG tablet Take 50 mg by mouth 4 (four) times daily as needed for moderate pain or severe pain.  01/16/15  Yes Historical Provider, MD  traZODone (DESYREL) 50 MG tablet Take 50 mg by mouth at bedtime. 07/17/15 07/16/16 Yes Historical Provider, MD      PHYSICAL EXAMINATION:   VITAL SIGNS: Blood pressure 186/148, pulse 87, temperature 97.2 F (36.2 C), temperature source Oral, resp. rate 19, height 5' (1.524 m), weight 44.77 kg (98 lb 11.2 oz), SpO2 95 %.  GENERAL:  82 y.o.-year-old patient lying in the bed with no acute distress.  EYES: Pupils equal, round, reactive to light and accommodation. No scleral icterus. Extraocular muscles intact.  HEENT: Head atraumatic, normocephalic. Oropharynx and nasopharynx clear.  NECK:  Supple, no jugular venous distention. No thyroid enlargement, no tenderness.  LUNGS: Normal breath sounds bilaterally, no wheezing, rales,rhonchi , few crepitations in the left base posteriorly. No use of accessory muscles of respiration.  CARDIOVASCULAR: S1, S2 normal. No murmurs, rubs, or gallops.  ABDOMEN: Soft, nontender, nondistended. Bowel sounds present. No organomegaly or mass.  EXTREMITIES: No pedal edema, cyanosis, or clubbing.  NEUROLOGIC: Cranial nerves II through XII are intact. Muscle strength 5/5 in all extremities. Sensation intact. Gait not checked.  PSYCHIATRIC: The patient is alert and  oriented x 3.  SKIN: No obvious rash, lesion, or ulcer. Multiple bruising and skin tears and lower extremities  LABORATORY PANEL:   CBC  Recent Labs Lab 11/18/15 1123  WBC 8.0  HGB 9.1*  HCT 28.4*  PLT 385  MCV 80.9  MCH 25.8*  MCHC 31.9*  RDW 22.2*  LYMPHSABS 0.8*  MONOABS 0.6  EOSABS 0.0  BASOSABS 0.1   ------------------------------------------------------------------------------------------------------------------  Chemistries   Recent Labs Lab 11/18/15 1123  NA 135  K 4.0  CL 103  CO2 25  GLUCOSE 107*  BUN 25*  CREATININE 1.00  CALCIUM 8.9  AST 26  ALT 17  ALKPHOS 50  BILITOT 0.5   ------------------------------------------------------------------------------------------------------------------  Cardiac Enzymes  Recent Labs Lab 11/18/15 1123  TROPONINI <0.03   ------------------------------------------------------------------------------------------------------------------  RADIOLOGY: Dg Chest 2 View  11/18/2015  CLINICAL DATA:  Stroke-like symptoms.  Confusion. EXAM: CHEST  2 VIEW COMPARISON:  02/17/2013 FINDINGS: The heart size and mediastinal contours are within normal limits. Aortic atherosclerosis noted. There is spondylosis within the thoracic spine. There is a moderate size hiatal hernia. On the lateral radiograph there is increased  opacity within the posterior left lung base suspicious for pneumonia. The visualized skeletal structures are unremarkable. IMPRESSION: 1. Left posterior lung base opacity, suspicious for pneumonia. Electronically Signed   By: Kerby Moors M.D.   On: 11/18/2015 11:54   Dg Shoulder Right  11/18/2015  CLINICAL DATA:  Stroke-like symptoms EXAM: RIGHT SHOULDER - 2+ VIEW COMPARISON:  None. FINDINGS: No acute fracture. No dislocation. Unremarkable soft tissues. Postoperative changes involving the peripheral clavicle and acromion on are noted. IMPRESSION: No acute bony pathology. Chronic and postoperative changes are noted.  Electronically Signed   By: Marybelle Killings M.D.   On: 11/18/2015 13:24   Ct Head Wo Contrast  11/18/2015  CLINICAL DATA:  Confusion, altered mental status EXAM: CT HEAD WITHOUT CONTRAST TECHNIQUE: Contiguous axial images were obtained from the base of the skull through the vertex without intravenous contrast. COMPARISON:  02/17/2013 FINDINGS: No skull fracture is noted. Paranasal sinuses and mastoid air cells are unremarkable. Stable cerebral atrophy. Stable periventricular and patchy subcortical chronic white matter disease. No intracranial hemorrhage, mass effect or midline shift. No acute cortical infarction. No mass lesion is noted on this unenhanced scan. IMPRESSION: No acute intracranial abnormality. No definite acute cortical infarction. Stable atrophy and chronic white matter disease. Electronically Signed   By: Lahoma Crocker M.D.   On: 11/18/2015 12:17    EKG: Orders placed or performed during the hospital encounter of 11/18/15  . EKG 12-Lead  . EKG 12-Lead    EKG in the emergency room revealed sinus rhythm at 76 bpm, normal axis, nonspecific repolarization abnormalities, no acute ST-T changes IMPRESSION AND PLAN:  Active Problems:   General weakness   Malignant essential hypertension   Pneumonia #1. Generalized weakness of unclear etiology at this time. Could be related to infection, pneumonia, getting physical therapy outpatient therapy and speech therapist evaluation #2. Malignant essential hypertension, continue Bystolic, advanced medications as needed #3. Questionable pneumonia, get CT scan of the chest to rule out or confirm pneumonia, continue antibiotic therapy if pneumonia is confirmed. Patient is relatively asymptomatic, no fevers or chills, no phlegm or cough or shortness of breath, white cell count is also normal #4 anemia, get Hemoccult  All the records are reviewed and case discussed with ED provider. Management plans discussed with the patient, family and they are in  agreement.  CODE STATUS: Code Status History    Date Active Date Inactive Code Status Order ID Comments User Context   01/20/2015  2:34 AM 01/22/2015  5:02 PM Full Code [id number]  Lance Coon, MD Inpatient       TOTAL TIME TAKING CARE OF THIS PATIENT: 50 minutes.    Theodoro Grist M.D on 11/18/2015 at 2:42 PM  Between 7am to 6pm - Pager - [phone] After 6pm go to [email or web address] - password [email or web address] [facility]  Office  [phone]  CC: Primary care physician; BABAOFF, Caryl Bis, MD

## 2015-11-18 NOTE — ED Notes (Signed)
Patient transported to X-ray 

## 2015-11-18 NOTE — Progress Notes (Signed)
Kristen Church is a 82 y.o. female patient admitted from ED awake, alert - oriented  X 4 - no acute distress noted.  VSS - Blood pressure 195/52, pulse 72, temperature 99.1 F (37.3 C), temperature source Oral, resp. rate 19, height 5' (1.524 m), weight 44.77 kg (98 lb 11.2 oz), SpO2 100 %.    IV in place, occlusive dsg intact without redness.  Orientation to room, and floor completed with information packet given to patient/family.Admission INP armband ID verified with patient/family, and in place.   SR up x 2, fall assessment complete, with patient and family able to verbalize understanding of risk associated with falls, and verbalized understanding to call nsg before up out of bed.  Call light within reach, patient able to voice, and demonstrate understanding.  Skin, clean-dry- intact without evidence of bruising, or skin tears.  Skin assessed with Juliann Pulse, RN. No evidence of skin break down noted on exam. Tele box verified with Prince Solian, RN and Juliann Pulse, Therapist, sports. Patient on box #2 in SR. Patient takes meds whole.      Will cont to eval and treat per MD orders.  Horton Finer, RN 11/18/2015 4:52 PM

## 2015-11-19 LAB — CBC
HEMATOCRIT: 25.1 % — AB (ref 35.0–47.0)
HEMOGLOBIN: 8.2 g/dL — AB (ref 12.0–16.0)
MCH: 26.3 pg (ref 26.0–34.0)
MCHC: 32.7 g/dL (ref 32.0–36.0)
MCV: 80.5 fL (ref 80.0–100.0)
Platelets: 360 10*3/uL (ref 150–440)
RBC: 3.12 MIL/uL — ABNORMAL LOW (ref 3.80–5.20)
RDW: 22.4 % — ABNORMAL HIGH (ref 11.5–14.5)
WBC: 9.3 10*3/uL (ref 3.6–11.0)

## 2015-11-19 LAB — TSH: TSH: 4.541 u[IU]/mL — ABNORMAL HIGH (ref 0.350–4.500)

## 2015-11-19 LAB — BASIC METABOLIC PANEL
ANION GAP: 6 (ref 5–15)
BUN: 28 mg/dL — AB (ref 6–20)
CHLORIDE: 104 mmol/L (ref 101–111)
CO2: 26 mmol/L (ref 22–32)
Calcium: 8.4 mg/dL — ABNORMAL LOW (ref 8.9–10.3)
Creatinine, Ser: 1 mg/dL (ref 0.44–1.00)
GFR calc Af Amer: 55 mL/min — ABNORMAL LOW (ref >60)
GFR calc non Af Amer: 48 mL/min — ABNORMAL LOW (ref >60)
GLUCOSE: 92 mg/dL (ref 65–99)
POTASSIUM: 4.1 mmol/L (ref 3.5–5.1)
Sodium: 136 mmol/L (ref 135–145)

## 2015-11-19 MED ORDER — LEVOTHYROXINE SODIUM 100 MCG PO TABS
100.0000 ug | ORAL_TABLET | Freq: Every day | ORAL | Status: DC
  Administered 2015-11-20: 100 ug via ORAL
  Filled 2015-11-19: qty 1

## 2015-11-19 MED ORDER — AMLODIPINE BESYLATE 5 MG PO TABS
5.0000 mg | ORAL_TABLET | Freq: Every day | ORAL | Status: DC
  Administered 2015-11-19: 5 mg via ORAL
  Filled 2015-11-19: qty 1

## 2015-11-19 NOTE — NC FL2 (Signed)
[place] LEVEL OF CARE SCREENING TOOL     IDENTIFICATION  Patient Name: Kristen Church Birthdate: [birth year] Sex: female Admission Date (Current Location): 11/18/2015  [place] and [place] Number:  Engineering geologist and Address:  [facility], [address] [place], [place] [address]      Provider Number: [id number]  Attending Physician Name and Address:  Vaughan Basta, MD  Relative Name and Phone Number:       Current Level of Care: Hospital Recommended Level of Care: [place] Prior Approval Number:    Date Approved/Denied:   PASRR Number: [name]  Discharge Plan: SNF    Current Diagnoses: Patient Active Problem List   Diagnosis Date Noted  . General weakness 11/18/2015  . Malignant essential hypertension 11/18/2015  . Pneumonia 11/18/2015  . Dehydration 01/20/2015  . Sigmoid diverticulitis 01/19/2015  . Pancreatic cyst 01/19/2015  . HTN (hypertension) 01/19/2015  . PMR (polymyalgia rheumatica) (HCC) 01/19/2015  . Hypothyroidism 01/19/2015  . Osteoarthritis 01/19/2015  . Right lower quadrant abdominal pain     Orientation RESPIRATION BLADDER Height & Weight     Self, Time, Place, Situation  Normal   Weight: 95 lb (43.092 kg) Height:  5' (152.4 cm)  BEHAVIORAL SYMPTOMS/MOOD NEUROLOGICAL BOWEL NUTRITION STATUS   (None)  (None)   Diet (DYS 3 thin fluid consistency )  AMBULATORY STATUS COMMUNICATION OF NEEDS Skin   Extensive Assist Verbally Normal                       Personal Care Assistance Level of Assistance  Bathing, Feeding, Dressing Bathing Assistance: Limited assistance Feeding assistance: Independent Dressing Assistance: Limited assistance     Functional Limitations Info  Sight, Hearing, Speech Sight Info: Adequate Hearing Info: Adequate Speech Info: Adequate    SPECIAL CARE FACTORS FREQUENCY  PT (By licensed PT)     PT Frequency: 5              Contractures       Additional Factors Info  Code Status, Allergies Code Status Info: Full Code Allergies Info: Celebrex, Macrobid, Neurontin, Sulfa Antibiotics, Other, Sulfamethoxazole           Current Medications (11/19/2015):  This is the current hospital active medication list Current Facility-Administered Medications  Medication Dose Route Frequency Provider Last Rate Last Dose  . 0.9 %  sodium chloride infusion  250 mL Intravenous PRN Theodoro Grist, MD      . acetaminophen (TYLENOL) tablet 650 mg  650 mg Oral Q6H PRN Theodoro Grist, MD       Or  . acetaminophen (TYLENOL) suppository 650 mg  650 mg Rectal Q6H PRN Theodoro Grist, MD      . enoxaparin (LOVENOX) injection 30 mg  30 mg Subcutaneous Q24H Theodoro Grist, MD   30 mg at 11/18/15 2000  . ferrous sulfate tablet 325 mg  325 mg Oral Daily Theodoro Grist, MD   325 mg at 11/19/15 0849  . folic acid (FOLVITE) tablet 1 mg  1 mg Oral Daily Theodoro Grist, MD   1 mg at 11/19/15 0849  . hydrALAZINE (APRESOLINE) injection 10 mg  10 mg Intravenous Q4H PRN Lance Coon, MD   10 mg at 11/19/15 1218  . hydroxychloroquine (PLAQUENIL) tablet 400 mg  400 mg Oral Daily Theodoro Grist, MD   400 mg at 11/19/15 0848  . levothyroxine (SYNTHROID, LEVOTHROID) tablet 88 mcg  88 mcg Oral Q0600 Theodoro Grist, MD   88 mcg  at 11/19/15 0600  . LORazepam (ATIVAN) injection 0.5 mg  0.5 mg Intravenous Once Theodoro Grist, MD      . Derrill Memo ON 11/20/2015] methotrexate (RHEUMATREX) tablet 15 mg  15 mg Oral Weekly Theodoro Grist, MD      . nebivolol (BYSTOLIC) tablet 5 mg  5 mg Oral Daily Theodoro Grist, MD   5 mg at 11/19/15 0849  . ondansetron (ZOFRAN) tablet 4 mg  4 mg Oral Q6H PRN Theodoro Grist, MD       Or  . ondansetron (ZOFRAN) injection 4 mg  4 mg Intravenous Q6H PRN Theodoro Grist, MD      . pramipexole (MIRAPEX) tablet 0.25 mg  0.25 mg Oral QHS Theodoro Grist, MD   0.25 mg at 11/18/15 2143  . predniSONE (DELTASONE) tablet 2.5 mg  2.5 mg Oral QODAY Theodoro Grist, MD   2.5 mg at  11/18/15 1649  . sodium chloride flush (NS) 0.9 % injection 3 mL  3 mL Intravenous Q12H Theodoro Grist, MD   3 mL at 11/19/15 1000  . sodium chloride flush (NS) 0.9 % injection 3 mL  3 mL Intravenous Q12H Theodoro Grist, MD   3 mL at 11/19/15 1000  . sodium chloride flush (NS) 0.9 % injection 3 mL  3 mL Intravenous PRN Theodoro Grist, MD      . traMADol (ULTRAM) tablet 50 mg  50 mg Oral QID PRN Theodoro Grist, MD   50 mg at 11/19/15 0443  . traZODone (DESYREL) tablet 50 mg  50 mg Oral QHS Theodoro Grist, MD   50 mg at 11/18/15 2143     Discharge Medications: Please see discharge summary for a list of discharge medications.  Relevant Imaging Results:  Relevant Lab Results:   Additional Information SSN [id number]  Kristen Church, LCSWA

## 2015-11-19 NOTE — Care Management Important Message (Addendum)
Important Message  Patient Details  Name: [name] MRN: [id number] Date of Birth: 25-Mar-1923   Medicare Important Message Given:   yes    Katrina Stack, RN 11/19/2015, 4:31 PM

## 2015-11-19 NOTE — Progress Notes (Signed)
[place] [facility] [place] NAME: Kristen Church    MR#:  [id number]  DATE OF BIRTH:  12-11-1922  SUBJECTIVE:  CHIEF COMPLAINT:   Chief Complaint  Patient presents with  . Altered Mental Status    Came with generalized weakness, progressively getting worse,a dn right shoulder pain   No source of infection.  REVIEW OF SYSTEMS:  CONSTITUTIONAL: No fever, positive for fatigue or weakness.  EYES: No blurred or double vision.  EARS, NOSE, AND THROAT: No tinnitus or ear pain.  RESPIRATORY: No cough, shortness of breath, wheezing or hemoptysis.  CARDIOVASCULAR: No chest pain, orthopnea, edema.  GASTROINTESTINAL: No nausea, vomiting, diarrhea or abdominal pain.  GENITOURINARY: No dysuria, hematuria.  ENDOCRINE: No polyuria, nocturia,  HEMATOLOGY: No anemia, easy bruising or bleeding SKIN: No rash or lesion. MUSCULOSKELETAL: No joint pain or arthritis.   NEUROLOGIC: No tingling, numbness, weakness.  PSYCHIATRY: No anxiety or depression.   ROS  DRUG ALLERGIES:   Allergies  Allergen Reactions  . Celebrex [Celecoxib]     confusion  . Macrobid [Nitrofurantoin Monohyd Macro]     Stiffness, redness, hot feeling  . Neurontin [Gabapentin]     dizziness  . Sulfa Antibiotics   . Other Rash    Eggplant  . Sulfamethoxazole Rash    VITALS:  Blood pressure 178/54, pulse 73, temperature 97.8 F (36.6 C), temperature source Oral, resp. rate 19, height 5' (1.524 m), weight 43.092 kg (95 lb), SpO2 100 %.  PHYSICAL EXAMINATION:   GENERAL: 82 y.o.-year-old patient lying in the bed with no acute distress.  EYES: Pupils equal, round, reactive to light and accommodation. No scleral icterus. Extraocular muscles intact.  HEENT: Head atraumatic, normocephalic. Oropharynx and nasopharynx clear.  NECK: Supple, no jugular venous distention. No thyroid enlargement, no tenderness.  LUNGS: Normal breath sounds bilaterally, no wheezing, rales,rhonchi , few  crepitations in the left base posteriorly. No use of accessory muscles of respiration.  CARDIOVASCULAR: S1, S2 normal. No murmurs, rubs, or gallops.  ABDOMEN: Soft, nontender, nondistended. Bowel sounds present. No organomegaly or mass.  EXTREMITIES: No pedal edema, cyanosis, or clubbing.  NEUROLOGIC: Cranial nerves II through XII are intact. Muscle strength 4/5 in all extremities. Sensation intact. Gait not checked.  PSYCHIATRIC: The patient is alert and oriented x 3.  SKIN: No obvious rash, lesion, or ulcer. Multiple bruising and skin tears and lower extremities  Physical Exam LABORATORY PANEL:   CBC  Recent Labs Lab 11/19/15 0345  WBC 9.3  HGB 8.2*  HCT 25.1*  PLT 360   ------------------------------------------------------------------------------------------------------------------  Chemistries   Recent Labs Lab 11/18/15 1123 11/19/15 0345  NA 135 136  K 4.0 4.1  CL 103 104  CO2 25 26  GLUCOSE 107* 92  BUN 25* 28*  CREATININE 1.00 1.00  CALCIUM 8.9 8.4*  AST 26  --   ALT 17  --   ALKPHOS 50  --   BILITOT 0.5  --    ------------------------------------------------------------------------------------------------------------------  Cardiac Enzymes  Recent Labs Lab 11/18/15 1123  TROPONINI <0.03   ------------------------------------------------------------------------------------------------------------------  RADIOLOGY:  Dg Chest 2 View  11/18/2015  CLINICAL DATA:  Stroke-like symptoms.  Confusion. EXAM: CHEST  2 VIEW COMPARISON:  02/17/2013 FINDINGS: The heart size and mediastinal contours are within normal limits. Aortic atherosclerosis noted. There is spondylosis within the thoracic spine. There is a moderate size hiatal hernia. On the lateral radiograph there is increased opacity within the posterior left lung base suspicious for pneumonia. The visualized skeletal structures are  unremarkable. IMPRESSION: 1. Left posterior lung base opacity, suspicious  for pneumonia. Electronically Signed   By: Kerby Moors M.D.   On: 11/18/2015 11:54   Dg Shoulder Right  11/18/2015  CLINICAL DATA:  Stroke-like symptoms EXAM: RIGHT SHOULDER - 2+ VIEW COMPARISON:  None. FINDINGS: No acute fracture. No dislocation. Unremarkable soft tissues. Postoperative changes involving the peripheral clavicle and acromion on are noted. IMPRESSION: No acute bony pathology. Chronic and postoperative changes are noted. Electronically Signed   By: Marybelle Killings M.D.   On: 11/18/2015 13:24   Ct Head Wo Contrast  11/18/2015  CLINICAL DATA:  Confusion, altered mental status EXAM: CT HEAD WITHOUT CONTRAST TECHNIQUE: Contiguous axial images were obtained from the base of the skull through the vertex without intravenous contrast. COMPARISON:  02/17/2013 FINDINGS: No skull fracture is noted. Paranasal sinuses and mastoid air cells are unremarkable. Stable cerebral atrophy. Stable periventricular and patchy subcortical chronic white matter disease. No intracranial hemorrhage, mass effect or midline shift. No acute cortical infarction. No mass lesion is noted on this unenhanced scan. IMPRESSION: No acute intracranial abnormality. No definite acute cortical infarction. Stable atrophy and chronic white matter disease. Electronically Signed   By: Lahoma Crocker M.D.   On: 11/18/2015 12:17   Ct Chest Wo Contrast  11/18/2015  CLINICAL DATA:  Multiple medical problems including essential hypertension, polymyalgia rheumatica, peripheral vascular disease, renal artery stenosis, osteoporosis, arthritis, hyperlipidemia, osteoporosis, who presents to the hospital with complaints of weakness, generalized in nature. The patient says she was doing well up until today morning when she woke up she noted that she is very weak and decided to come to emergency room for further evaluation. According to EMS, patient had some increasing confusion as well. She complained of right shoulder pain which was worse with movement, but  denied any trauma. EXAM: CT CHEST WITHOUT CONTRAST TECHNIQUE: Multidetector CT imaging of the chest was performed following the standard protocol without IV contrast. COMPARISON:  Current chest radiograph FINDINGS: Cardiovascular: The heart is normal in size. Small pericardial effusion. No significant coronary artery calcifications. Aorta is normal caliber. Scattered atherosclerotic calcifications noted along the thoracic aorta and its branch vessels. Mediastinum/Nodes: Moderate size hiatal hernia. No mediastinal or hilar masses or pathologically enlarged lymph nodes. Lungs/Pleura: In the right upper lobe there is a 5.5 x 6.5 mm nodule (6 mm mean diameter) on image 39 of series 3. 2 mm nodule in the anterior inferior right upper lobe near the minor fissure, image 82. Minimal dependent subsegmental atelectasis mostly in the lower lobes at the posterior lung bases. Lungs otherwise clear. No pleural effusion. No pneumothorax. Upper Abdomen: No acute finding. Musculoskeletal: No fracture. No bone lesion. Arthropathic changes of the shoulders, greater on the right. There are degenerative changes of the thoracic spine. Bones are demineralized. IMPRESSION: 1. No acute findings. 2. 6 mm right upper lobe nodule with a second, 2 mm right upper lobe nodule. Non-contrast chest CT at 3-6 months is recommended. If the nodules are stable at time of repeat CT, then future CT at 18-24 months (from today's scan) is considered optional for low-risk patients, but is recommended for high-risk patients. This recommendation follows the consensus statement: Guidelines for Management of Incidental Pulmonary Nodules Detected on CT Images:From the Fleischner Society 2017; published online before print (10.1148/radiol.IJ:2314499). Electronically Signed   By: Lajean Manes M.D.   On: 11/18/2015 15:11    ASSESSMENT AND PLAN:   Active Problems:   General weakness   Malignant essential hypertension  Pneumonia   #1. Generalized weakness  of unclear etiology at this time.   Infection work ups are negative, no pneumonia on CT chest   Check TSH- high- increased levothyroxine from 88 to 100 mcg.    PT suggested SNF placement. #2. Malignant essential hypertension, continue Bystolic,    Added amlodipine. #3. Questionable pneumonia,    Suspected on admission, but ruled out by negative CT.   #4 anemia, get Hemoccult   Though less likely blood loss, as it is chronic and have normal MCV- this can be due to old age.   All the records are reviewed and case discussed with Care Management/Social Workerr. Management plans discussed with the patient, family and they are in agreement.  CODE STATUS: full.  TOTAL TIME TAKING CARE OF THIS PATIENT: 35 minutes.    POSSIBLE D/C IN 1-2 DAYS, DEPENDING ON CLINICAL CONDITION.   Vaughan Basta M.D on 11/19/2015   Between 7am to 6pm - Pager - [phone]  After 6pm go to [email or web address] - password [email or web address] [facility]  Office  [phone]  CC: Primary care physician; BABAOFF, Caryl Bis, MD  Note: This dictation was prepared with Dragon dictation along with smaller phrase technology. Any transcriptional errors that result from this process are unintentional.

## 2015-11-19 NOTE — Evaluation (Addendum)
Occupational Therapy Evaluation Patient Details Name: Kristen Church MRN: [id number] DOB: Jun 28, 1922 Today's Date: 11/19/2015    History of Present Illness Pt. is a 82 yo F presented to ED from [facility] for stroke like symptoms and confusion. She was admitted for generalized weakness and further tesing is in progress. PMH includes PMR, PVD, renal artery stenosis, and hypothyroidism.   Clinical Impression   Pt. Is a 82 y.o. Female who was admitted with generalized weakness. Pt. Presents with weakness, decreased strength, decreased BUE proximal shoulder ROM, decreased functional mobility which hinder her ability to complete ADL and IADL functioning. Pt. Could benefit from skilled OT services for ADL and A/E training, UE functional mobility for ADLs, UE ROM, and education about energy conservation/work simplification techniques in order to improve ADL functioning.    Follow Up Recommendations  SNF    Equipment Recommendations       Recommendations for Other Services PT consult     Precautions / Restrictions Precautions Precautions: Fall Restrictions Weight Bearing Restrictions: No              ADL Overall ADL's : Needs assistance/impaired Eating/Feeding: Set up   Grooming: Set up;Sitting           Upper Body Dressing : Moderate assistance   Lower Body Dressing: Moderate assistance               Functional mobility during ADLs: Minimal assistance       Vision     Perception     Praxis      Pertinent Vitals/Pain Pain Assessment: No/denies pain     Hand Dominance     Extremity/Trunk Assessment Upper Extremity Assessment Upper Extremity Assessment: Generalized weakness (Biliateral shoulder limitations.)   Cervical / Trunk Assessment Cervical / Trunk Assessment: Kyphotic   Communication Communication Communication: HOH   Cognition Arousal/Alertness: Awake/alert Behavior During Therapy: WFL for tasks assessed/performed Overall Cognitive Status:  Impaired/Different from baseline Area of Impairment: Memory;Awareness;Safety/judgement     Memory: Decreased short-term memory   Safety/Judgement: Decreased awareness of deficits     General Comments: Pt repeats herself a lot.   General Comments       Exercises   Shoulder Instructions      Home Living Family/patient expects to be discharged to:: Other (Comment) ([place] independent apartment.)                                        Prior Functioning/Environment Level of Independence: Independent with assistive device(s)        Comments: Pt has been independent with ADLs, and limited ambulation with rollator. Her health has been gradually declining.     OT Diagnosis: Generalized weakness   OT Problem List: Decreased strength;Decreased activity tolerance;Decreased safety awareness;Decreased knowledge of use of DME or AE;Decreased range of motion   OT Treatment/Interventions: Self-care/ADL training;Therapeutic activities;Therapeutic exercise;DME and/or AE instruction;Patient/family education;Energy conservation    OT Goals(Current goals can be found in the care plan section) Acute Rehab OT Goals Patient Stated Goal: To transition to Assisted Living. OT Goal Formulation: With patient  OT Frequency: Min 1X/week   Barriers to D/C:            Co-evaluation              End of Session Equipment Utilized During Treatment: Gait belt  Activity Tolerance: Patient tolerated treatment well Patient left: in chair;with call bell/phone within  reach;with chair alarm set   Time: [id number] OT Time Calculation (min): 23 min Charges:  OT General Charges $OT Visit: 1 Procedure OT Evaluation $OT Eval Moderate Complexity: 1 Procedure G-Codes:    Harrel Carina, MS, OTR/L Harrel Carina 11/19/2015, 11:54 AM

## 2015-11-19 NOTE — Clinical Social Work Note (Signed)
Clinical Social Work Assessment  Patient Details  Name: Kristen Church MRN: [id number] Date of Birth: 31-Mar-1923  Date of referral:  11/19/15               Reason for consult:  Discharge Planning                Permission sought to share information with:  Family Supports Permission granted to share information::     Name::        Agency::     Relationship::   Joaquim Lai - Friend)  Contact Information:     Housing/Transportation Living arrangements for the past 2 months:  [facility]) Source of Information:  Patient Patient Interpreter Needed:  None Criminal Activity/Legal Involvement Pertinent to Current Situation/Hospitalization:  No - Comment as needed Significant Relationships:  Other Family Members Lives with:  Self Do you feel safe going back to the place where you live?  Yes Need for family participation in patient care:  No (Coment)  Care giving concerns:  PT and OT recommended SNF placement for patient.    Social Worker assessment / plan:  CSW met with patient at bedside. Per patient she is from [facility]. She reports that she's open to SNF placement. CSW informed patient with Medicare she would need a 3 night qualifying inpatient stay for Medicare to cover SNF placement. CSW informed patient that she may be stable for discharge tomorrow. She reports she understands. CSW informed patient that Seth Bake- Admissions Coordinator at [facility] reports that patient's PT will be covered by her Medicare part B. Patient will get 3 days room and board free and then room and board will be $265 a day. Patient is in agreement. CSW will continue to follow and assist.   Employment status:  Retired Forensic scientist:  Medicare PT Recommendations:  [place] / Referral to community resources:  [place]  Patient/Family's Response to care:  Patient is in agreement to discharge to [facility] under her  Medicare B; 3 free nights stay and then $265 per day for room and board.   Patient/Family's Understanding of and Emotional Response to Diagnosis, Current Treatment, and Prognosis:  Patient understands her diagnosis and appreciates of CSW's assistance.   Emotional Assessment Appearance:  Appears stated age Attitude/Demeanor/Rapport:   (None) Affect (typically observed):  Calm, Pleasant Orientation:  Oriented to Self, Oriented to Place, Oriented to  Time, Oriented to Situation Alcohol / Substance use:  Not Applicable Psych involvement (Current and /or in the community):  No (Comment)  Discharge Needs  Concerns to be addressed:  Discharge Planning Concerns Readmission within the last 30 days:  No Current discharge risk:  Chronically ill Barriers to Discharge:  Continued Medical Work up   [employer], LCSW 11/19/2015, 4:09 PM

## 2015-11-19 NOTE — Care Management (Addendum)
Patient has ruled out for pneumonia.  It is anticipated that patient will be medically stable for discharge 7/5.  Will have to discuss private pay to go to health care building at [facility].  Discussed medical necessity and medicare criteria for skilled nursing.  Patient verbalizes understanding.  She acknowledges that she is too weak to go home and has been considering assisted living level of care.

## 2015-11-19 NOTE — Evaluation (Signed)
Physical Therapy Evaluation Patient Details Name: Kristen Church MRN: [id number] DOB: 11/28/1922 Today's Date: 11/19/2015   History of Present Illness  82 yo F presented to ED from [facility] for stroke like symptoms and confusion. She was admitted for generalized weakness and further tesing is in progress. PMH includes PMR, PVD, renal artery stenosis, and hypothyroidism.  Clinical Impression  Pt is a very frail elderly woman that demonstrated generalized weakness and difficulty walking due to declining health. Her UE strength is grossly 3/5, and LE strength grossly 4-/5. Seated balance is good, but standing balance fair. She required min A for bed mobility, transfers and limited ambulation with FWW. Her supine BP was 167/45 and seated 123/51 with some lightheadedness temporarily. STR is recommended after hospital discharge to address deficits of strength, endurance, balance and gait to progress towards PLOF. Pt will benefit from skilled PT services to increase functional I and mobility for safe discharge.     Follow Up Recommendations SNF    Equipment Recommendations  None recommended by PT    Recommendations for Other Services       Precautions / Restrictions Precautions Precautions: Fall Restrictions Weight Bearing Restrictions: No      Mobility  Bed Mobility Overal bed mobility: Needs Assistance Bed Mobility: Supine to Sit     Supine to sit: Min assist;HOB elevated     General bed mobility comments: assistance to get trunk upright  Transfers Overall transfer level: Needs assistance Equipment used: Rolling walker (2 wheeled) Transfers: Sit to/from [facility] Sit to Stand: Min assist Stand pivot transfers: Min assist       General transfer comment: initial posterior lean, pulls up on FWW, cues for safety  Ambulation/Gait Ambulation/Gait assistance: Min assist Ambulation Distance (Feet): 5 Feet Assistive device: Rolling walker (2 wheeled) Gait  Pattern/deviations: Decreased stride length;Trunk flexed;Narrow base of support Gait velocity: reduced Gait velocity interpretation: <1.8 ft/sec, indicative of risk for recurrent falls General Gait Details: Unsteady and slow speed. Postural sway but no complete LOB. R knee moderate valgus.  Stairs            Wheelchair Mobility    Modified Rankin (Stroke Patients Only)       Balance Overall balance assessment: Needs assistance Sitting-balance support: Bilateral upper extremity supported;Feet supported Sitting balance-Leahy Scale: Good     Standing balance support: Bilateral upper extremity supported Standing balance-Leahy Scale: Fair Standing balance comment: maintains without reaching out of BOS, unsteady without UE support                             Pertinent Vitals/Pain Pain Assessment: No/denies pain    Home Living Family/patient expects to be discharged to:: Other (Comment) (Independent living apartment at [facility])                      Prior Function Level of Independence: Independent with assistive device(s)         Comments: Pt has been independent with ADLs, and limited ambulation with rollator. Her health has been gradually declining.      Hand Dominance        Extremity/Trunk Assessment   Upper Extremity Assessment: Generalized weakness (grossly 3/5)           Lower Extremity Assessment: Generalized weakness (grossly 4-/5)      Cervical / Trunk Assessment: Kyphotic  Communication   Communication: HOH  Cognition Arousal/Alertness: Awake/alert Behavior During Therapy: WFL for tasks  assessed/performed Overall Cognitive Status: Impaired/Different from baseline Area of Impairment: Memory;Safety/judgement;Awareness (pt repeats herself a lot)     Memory: Decreased short-term memory   Safety/Judgement: Decreased awareness of safety     General Comments: Pt repeats herself a lot.    General Comments General  comments (skin integrity, edema, etc.): very frail, multiple bruised areas and skin tears    Exercises Other Exercises Other Exercises: B LE therex: supine: ankle pumps, heel slides, hip abd slides; seated: LAQs, marching x10 each. Cues for technique and staying on task.      Assessment/Plan    PT Assessment Patient needs continued PT services  PT Diagnosis Difficulty walking;Generalized weakness   PT Problem List Decreased strength;Decreased activity tolerance;Decreased balance;Decreased mobility;Decreased cognition;Decreased safety awareness;Decreased skin integrity  PT Treatment Interventions Gait training;Therapeutic activities;Therapeutic exercise;Balance training;Neuromuscular re-education;Patient/family education   PT Goals (Current goals can be found in the Care Plan section) Acute Rehab PT Goals Patient Stated Goal: to get stronger PT Goal Formulation: With patient Time For Goal Achievement: 12/03/15 Potential to Achieve Goals: Fair    Frequency Min 2X/week   Barriers to discharge Decreased caregiver support pt lives alone in independent living    Co-evaluation               End of Session Equipment Utilized During Treatment: Gait belt Activity Tolerance: Patient limited by fatigue Patient left: in chair;with call bell/phone within reach;with chair alarm set Nurse Communication: Mobility status         Time: [id number] PT Time Calculation (min) (ACUTE ONLY): 29 min   Charges:   PT Evaluation $PT Eval Moderate Complexity: 1 Procedure PT Treatments $Therapeutic Exercise: 8-22 mins   PT G Codes:        Neoma Laming, PT, DPT  11/19/2015, 10:59 AM [phone]

## 2015-11-19 NOTE — Progress Notes (Signed)
CSW received a consult stating that patient is from a facility. Patient is from [facility]. Confirmed with Seth Bake- Admissions Coordinator at [facility]. There are no CSW needs at this time CSW is available if a need were to to arise. CSW is singing off.  Ernest Pine, MSW, [place], [place] Clinical Social Worker [phone]

## 2015-11-19 NOTE — Care Management (Addendum)
Patient presents with weakness and altered mental statusPatient is a resident of [place] independent living level of care.  It is reported that prior to this admission, assisted living level of care was being discussed.  She has a walker at home.  She has been evaluated by physical therapy and skilled nursing is being recommended.  At present, not able to determine if there will a 3 night stay that will qualify for a medicare covered stay but facility says will "work with patient."  Updated csw

## 2015-11-19 NOTE — Progress Notes (Signed)
Patient presently oob sitting in the chair, remaisn alert and oriented, denies any pain, MD at bedside.

## 2015-11-20 MED ORDER — AMLODIPINE BESYLATE 10 MG PO TABS
10.0000 mg | ORAL_TABLET | Freq: Every day | ORAL | Status: DC
  Administered 2015-11-20: 10 mg via ORAL
  Filled 2015-11-20: qty 1

## 2015-11-20 MED ORDER — AMLODIPINE BESYLATE 10 MG PO TABS: 10.0000 mg | ORAL_TABLET | Freq: Every day | ORAL | Status: AC

## 2015-11-20 MED ORDER — TRAMADOL HCL 50 MG PO TABS: 50.0000 mg | ORAL_TABLET | Freq: Four times a day (QID) | ORAL | Status: AC | PRN

## 2015-11-20 MED ORDER — LEVOTHYROXINE SODIUM 100 MCG PO TABS: 100.0000 ug | ORAL_TABLET | Freq: Every day | ORAL | Status: AC

## 2015-11-20 NOTE — Discharge Summary (Signed)
[address] 82 y.o., DOB 08/02/1922, MRN [id number]. Admission date: 11/18/2015 Discharge Date 11/20/2015 Primary MD BABAOFF, Caryl Bis, MD Admitting Physician Theodoro Grist, MD  Admission Diagnosis  Pneumonia [J18.9] Weakness generalized [R53.1] Weakness [R53.1] Community acquired pneumonia [J18.9]  Discharge Diagnosis   Active Problems:   General weakness   Malignant essential hypertension   Pneumonia ruled out based on a CT  Essential hypertension Polymyalgia rheumatica Peripheral vascular disease Renal artery stenosis Osteoarthritis Hyperlipidemia Hypothyroidism Osteoporosis Anemia likely due to anemia of chronic disease    Hospital Course Kristen Church is a 82 y.o. female with a known history of Multiple medical problems including essential hypertension, polymyalgia rheumatica, peripheral vascular disease, renal artery stenosis, osteoporosis, arthritis, hyperlipidemia, osteoporosis, who presents to the hospital with complaints of weakness, generalized in nature. The patient, she was doing well up until daily admission when she woke up she noted that she is very weak . She was also noticed to be confused. She was seen in the emergency room and thought to have pneumonia. However that was ruled out with a CT scan of the chest. Patient had a CT scan of the head which showed no acute abnormality. She was seen by physical therapy who recommended skilled nursing facility which is currently being arranged.            Consults  None  Significant Tests:  See full reports for all details    Dg Chest 2 View  11/18/2015  CLINICAL DATA:  Stroke-like symptoms.  Confusion. EXAM: CHEST  2 VIEW COMPARISON:  02/17/2013 FINDINGS: The heart size and mediastinal contours are within normal limits. Aortic atherosclerosis noted. There is spondylosis within the thoracic spine. There is a moderate size hiatal hernia. On the lateral radiograph there is increased opacity within the posterior left lung  base suspicious for pneumonia. The visualized skeletal structures are unremarkable. IMPRESSION: 1. Left posterior lung base opacity, suspicious for pneumonia. Electronically Signed   By: Kerby Moors M.D.   On: 11/18/2015 11:54   Dg Shoulder Right  11/18/2015  CLINICAL DATA:  Stroke-like symptoms EXAM: RIGHT SHOULDER - 2+ VIEW COMPARISON:  None. FINDINGS: No acute fracture. No dislocation. Unremarkable soft tissues. Postoperative changes involving the peripheral clavicle and acromion on are noted. IMPRESSION: No acute bony pathology. Chronic and postoperative changes are noted. Electronically Signed   By: Marybelle Killings M.D.   On: 11/18/2015 13:24   Ct Head Wo Contrast  11/18/2015  CLINICAL DATA:  Confusion, altered mental status EXAM: CT HEAD WITHOUT CONTRAST TECHNIQUE: Contiguous axial images were obtained from the base of the skull through the vertex without intravenous contrast. COMPARISON:  02/17/2013 FINDINGS: No skull fracture is noted. Paranasal sinuses and mastoid air cells are unremarkable. Stable cerebral atrophy. Stable periventricular and patchy subcortical chronic white matter disease. No intracranial hemorrhage, mass effect or midline shift. No acute cortical infarction. No mass lesion is noted on this unenhanced scan. IMPRESSION: No acute intracranial abnormality. No definite acute cortical infarction. Stable atrophy and chronic white matter disease. Electronically Signed   By: Lahoma Crocker M.D.   On: 11/18/2015 12:17   Ct Chest Wo Contrast  11/18/2015  CLINICAL DATA:  Multiple medical problems including essential hypertension, polymyalgia rheumatica, peripheral vascular disease, renal artery stenosis, osteoporosis, arthritis, hyperlipidemia, osteoporosis, who presents to the hospital with complaints of weakness, generalized in nature. The patient says she was doing well up until today morning when she woke up she noted that she is very weak and decided to come to emergency room  for further  evaluation. According to EMS, patient had some increasing confusion as well. She complained of right shoulder pain which was worse with movement, but denied any trauma. EXAM: CT CHEST WITHOUT CONTRAST TECHNIQUE: Multidetector CT imaging of the chest was performed following the standard protocol without IV contrast. COMPARISON:  Current chest radiograph FINDINGS: Cardiovascular: The heart is normal in size. Small pericardial effusion. No significant coronary artery calcifications. Aorta is normal caliber. Scattered atherosclerotic calcifications noted along the thoracic aorta and its branch vessels. Mediastinum/Nodes: Moderate size hiatal hernia. No mediastinal or hilar masses or pathologically enlarged lymph nodes. Lungs/Pleura: In the right upper lobe there is a 5.5 x 6.5 mm nodule (6 mm mean diameter) on image 39 of series 3. 2 mm nodule in the anterior inferior right upper lobe near the minor fissure, image 82. Minimal dependent subsegmental atelectasis mostly in the lower lobes at the posterior lung bases. Lungs otherwise clear. No pleural effusion. No pneumothorax. Upper Abdomen: No acute finding. Musculoskeletal: No fracture. No bone lesion. Arthropathic changes of the shoulders, greater on the right. There are degenerative changes of the thoracic spine. Bones are demineralized. IMPRESSION: 1. No acute findings. 2. 6 mm right upper lobe nodule with a second, 2 mm right upper lobe nodule. Non-contrast chest CT at 3-6 months is recommended. If the nodules are stable at time of repeat CT, then future CT at 18-24 months (from today's scan) is considered optional for low-risk patients, but is recommended for high-risk patients. This recommendation follows the consensus statement: Guidelines for Management of Incidental Pulmonary Nodules Detected on CT Images:From the Fleischner Society 2017; published online before print (10.1148/radiol.IJ:2314499). Electronically Signed   By: Lajean Manes M.D.   On: 11/18/2015  15:11       Today   Subjective:   Kristen Church  patient still feeling weak. Denies any other complaints  Objective:   Blood pressure 176/56, pulse 66, temperature 98.5 F (36.9 C), temperature source Oral, resp. rate 16, height 5' (1.524 m), weight 43.092 kg (95 lb), SpO2 97 %.  .  Intake/Output Summary (Last 24 hours) at 11/20/15 0947 Last data filed at 11/20/15 0936  Gross per 24 hour  Intake    720 ml  Output    300 ml  Net    420 ml    Exam VITAL SIGNS: Blood pressure 176/56, pulse 66, temperature 98.5 F (36.9 C), temperature source Oral, resp. rate 16, height 5' (1.524 m), weight 43.092 kg (95 lb), SpO2 97 %.  GENERAL:  82 y.o.-year-old patient lying in the bed with no acute distress.  EYES: Pupils equal, round, reactive to light and accommodation. No scleral icterus. Extraocular muscles intact.  HEENT: Head atraumatic, normocephalic. Oropharynx and nasopharynx clear.  NECK:  Supple, no jugular venous distention. No thyroid enlargement, no tenderness.  LUNGS: Normal breath sounds bilaterally, no wheezing, rales,rhonchi or crepitation. No use of accessory muscles of respiration.  CARDIOVASCULAR: S1, S2 normal. No murmurs, rubs, or gallops.  ABDOMEN: Soft, nontender, nondistended. Bowel sounds present. No organomegaly or mass.  EXTREMITIES: No pedal edema, cyanosis, or clubbing.  NEUROLOGIC: Cranial nerves II through XII are intact. Muscle strength 5/5 in all extremities. Sensation intact. Gait not checked.  PSYCHIATRIC: The patient is alert and oriented x 3.  SKIN: No obvious rash, lesion, or ulcer.   Data Review     CBC w Diff: Lab Results  Component Value Date   WBC 9.3 11/19/2015   WBC 8.7 05/27/2014   HGB 8.2* 11/19/2015  HGB 8.8* 05/27/2014   HCT 25.1* 11/19/2015   HCT 28.8* 05/27/2014   PLT 360 11/19/2015   PLT 489* 05/27/2014   LYMPHOPCT 10 11/18/2015   LYMPHOPCT 10.1 05/27/2014   MONOPCT 7 11/18/2015   MONOPCT 7.4 05/27/2014   EOSPCT 0  11/18/2015   EOSPCT 0.3 05/27/2014   BASOPCT 1 11/18/2015   BASOPCT 1.2 05/27/2014   CMP: Lab Results  Component Value Date   NA 136 11/19/2015   NA 138 05/27/2014   K 4.1 11/19/2015   K 4.1 05/27/2014   CL 104 11/19/2015   CL 106 05/27/2014   CO2 26 11/19/2015   CO2 24 05/27/2014   BUN 28* 11/19/2015   BUN 24* 05/27/2014   CREATININE 1.00 11/19/2015   CREATININE 1.20 05/27/2014   PROT 6.3* 11/18/2015   PROT 6.7 02/17/2013   ALBUMIN 3.6 11/18/2015   ALBUMIN 3.3* 02/17/2013   BILITOT 0.5 11/18/2015   BILITOT 0.7 02/17/2013   ALKPHOS 50 11/18/2015   ALKPHOS 63 02/17/2013   AST 26 11/18/2015   AST 34 02/17/2013   ALT 17 11/18/2015   ALT 24 02/17/2013  .  Micro Results Recent Results (from the past 240 hour(s))  Blood culture (routine x 2)     Status: None (Preliminary result)   Collection Time: 11/18/15 12:38 PM  Result Value Ref Range Status   Specimen Description BLOOD LEFT ASSIST CONTROL  Final   Special Requests   Final    BOTTLES DRAWN AEROBIC AND ANAEROBIC  AERO [place] ANA 5CC   Culture NO GROWTH 2 DAYS  Final   Report Status PENDING  Incomplete  Blood culture (routine x 2)     Status: None (Preliminary result)   Collection Time: 11/18/15 12:39 PM  Result Value Ref Range Status   Specimen Description BLOOD RIGHT WRIST  Final   Special Requests BOTTLES DRAWN AEROBIC AND ANAEROBIC  1CC  Final   Culture NO GROWTH 2 DAYS  Final   Report Status PENDING  Incomplete        Code Status Orders        Start     Ordered   11/18/15 1623  Full code   Continuous     11/18/15 1623    Code Status History    Date Active Date Inactive Code Status Order ID Comments User Context   01/20/2015  2:34 AM 01/22/2015  5:02 PM Full Code [id number]  Lance Coon, MD Inpatient          Follow-up Information    Follow up with BABAOFF, Caryl Bis, MD In 7 days.   Specialty:  Family Medicine   Contact information:   [address] [facility] [place] [address] [phone]       Discharge Medications     Medication List    TAKE these medications        amLODipine 10 MG tablet  Commonly known as:  NORVASC  Take 1 tablet (10 mg total) by mouth daily.     ferrous sulfate 325 (65 FE) MG tablet  Take 325 mg by mouth daily.     folic acid 1 MG tablet  Commonly known as:  FOLVITE  Take 1 mg by mouth daily.     hydroxychloroquine 200 MG tablet  Commonly known as:  PLAQUENIL  Take 400 mg by mouth daily.     levothyroxine 100 MCG tablet  Commonly known as:  SYNTHROID, LEVOTHROID  Take 1 tablet (  100 mcg total) by mouth daily before breakfast.     methotrexate 2.5 MG tablet  Commonly known as:  RHEUMATREX  take 6 tablets by mouth EVERY WEEK     nebivolol 5 MG tablet  Commonly known as:  BYSTOLIC  Take 5 mg by mouth daily.     pramipexole 0.25 MG tablet  Commonly known as:  MIRAPEX  Take 0.25 mg by mouth at bedtime.     predniSONE 5 MG tablet  Commonly known as:  DELTASONE  Take 2.5 mg by mouth every other day.     traMADol 50 MG tablet  Commonly known as:  ULTRAM  Take 1 tablet (50 mg total) by mouth 4 (four) times daily as needed for moderate pain or severe pain.     traZODone 50 MG tablet  Commonly known as:  DESYREL  Take 50 mg by mouth at bedtime.           Total Time in preparing paper work, data evaluation and todays exam - 35 minutes  Dustin Flock M.D on 11/20/2015 at 9:47 AM  [facility]   Office  [phone]

## 2015-11-20 NOTE — Progress Notes (Signed)
Clinical Social Worker was informed that patient will be medically ready to discharge to [facility]. Patient is in a agreement with plan. CSW called Seth Bake- Admissions Coordinator at [facility] to confirm that patient's bed is ready. Provided patient's room number 229 and number to call for report [phone] . All discharge information faxed to [facility] via [place]. Rx's added to discharge packet.   Call to patient's POA- Marlou Sa [phone], left message to inform him patient would discharge. RN will call report and patient will discharge to [facility] via facility transport at [place], MSW, [place], [place] Social Worker [phone]

## 2015-11-20 NOTE — Discharge Instructions (Signed)

## 2015-11-20 NOTE — Clinical Social Work Placement (Signed)
   CLINICAL SOCIAL WORK PLACEMENT  NOTE  Date:  11/20/2015  Patient Details  Name: Kristen Church MRN: [id number] Date of Birth: 05/08/1923  Clinical Social Work is seeking post-discharge placement for this patient at the [place] level of care (*CSW will initial, date and re-position this form in  chart as items are completed):  No   Patient/family provided with [place] Work Department's list of facilities offering this level of care within the geographic area requested by the patient (or if unable, by the patient's family).  No   Patient/family informed of their freedom to choose among providers that offer the needed level of care, that participate in Medicare, Medicaid or managed care program needed by the patient, have an available bed and are willing to accept the patient.  No   Patient/family informed of [place]'s ownership interest in [facility] and [facility], as well as of the fact that they are under no obligation to receive care at these facilities.  PASRR submitted to EDS on       PASRR number received on       Existing PASRR number confirmed on 11/20/15     FL2 transmitted to all facilities in geographic area requested by pt/family on 11/20/15     FL2 transmitted to all facilities within larger geographic area on       Patient informed that his/her managed care company has contracts with or will negotiate with certain facilities, including the following:        Yes   Patient/family informed of bed offers received.  Patient chooses bed at  [facility])     Physician recommends and patient chooses bed at      Patient to be transferred to  ([facility]) on 11/20/15.  Patient to be transferred to facility by  [facility])     Patient family notified on 11/20/15 of transfer.  Name of family member notified:   Marlou Sa- [place])     PHYSICIAN       Additional Comment:     _______________________________________________ Baldemar Lenis, LCSW 11/20/2015, 10:03 AM

## 2015-11-20 NOTE — Care Management Important Message (Signed)
Important Message  Patient Details  Name: Kristen Church MRN: [id number] Date of Birth: Sep 21, 1922   Medicare Important Message Given:  Yes    Juliann Pulse A Cathyrn Deas 11/20/2015, 10:30 AM

## 2015-11-20 NOTE — Progress Notes (Signed)
Patient discharged to rehab, report given to receiving nurse

## 2015-11-21 DIAGNOSIS — E441 Mild protein-calorie malnutrition: Secondary | ICD-10-CM

## 2015-11-21 DIAGNOSIS — E039 Hypothyroidism, unspecified: Secondary | ICD-10-CM

## 2015-11-21 DIAGNOSIS — M069 Rheumatoid arthritis, unspecified: Secondary | ICD-10-CM

## 2015-11-21 DIAGNOSIS — I10 Essential (primary) hypertension: Secondary | ICD-10-CM

## 2015-11-21 DIAGNOSIS — G301 Alzheimer's disease with late onset: Secondary | ICD-10-CM

## 2015-11-21 DIAGNOSIS — M353 Polymyalgia rheumatica: Secondary | ICD-10-CM

## 2015-11-21 DIAGNOSIS — R531 Weakness: Secondary | ICD-10-CM | POA: Diagnosis not present

## 2015-11-23 LAB — CULTURE, BLOOD (ROUTINE X 2)
Culture: NO GROWTH
Culture: NO GROWTH

## 2015-12-19 DIAGNOSIS — K921 Melena: Secondary | ICD-10-CM | POA: Diagnosis not present

## 2015-12-25 DIAGNOSIS — M353 Polymyalgia rheumatica: Secondary | ICD-10-CM

## 2015-12-25 DIAGNOSIS — M0600 Rheumatoid arthritis without rheumatoid factor, unspecified site: Secondary | ICD-10-CM

## 2015-12-25 DIAGNOSIS — E441 Mild protein-calorie malnutrition: Secondary | ICD-10-CM

## 2015-12-25 DIAGNOSIS — I10 Essential (primary) hypertension: Secondary | ICD-10-CM

## 2015-12-25 DIAGNOSIS — F39 Unspecified mood [affective] disorder: Secondary | ICD-10-CM

## 2015-12-25 DIAGNOSIS — G301 Alzheimer's disease with late onset: Secondary | ICD-10-CM

## 2016-01-21 DIAGNOSIS — E43 Unspecified severe protein-calorie malnutrition: Secondary | ICD-10-CM

## 2016-01-21 DIAGNOSIS — M05611 Rheumatoid arthritis of right shoulder with involvement of other organs and systems: Secondary | ICD-10-CM

## 2016-01-21 DIAGNOSIS — M353 Polymyalgia rheumatica: Secondary | ICD-10-CM

## 2016-01-21 DIAGNOSIS — E039 Hypothyroidism, unspecified: Secondary | ICD-10-CM | POA: Diagnosis not present

## 2016-01-21 DIAGNOSIS — I10 Essential (primary) hypertension: Secondary | ICD-10-CM | POA: Diagnosis not present

## 2016-01-21 DIAGNOSIS — F39 Unspecified mood [affective] disorder: Secondary | ICD-10-CM

## 2016-02-11 DIAGNOSIS — B351 Tinea unguium: Secondary | ICD-10-CM

## 2016-02-27 DIAGNOSIS — M069 Rheumatoid arthritis, unspecified: Secondary | ICD-10-CM

## 2016-02-27 DIAGNOSIS — F39 Unspecified mood [affective] disorder: Secondary | ICD-10-CM

## 2016-02-27 DIAGNOSIS — E441 Mild protein-calorie malnutrition: Secondary | ICD-10-CM

## 2016-02-27 DIAGNOSIS — M353 Polymyalgia rheumatica: Secondary | ICD-10-CM

## 2016-02-27 DIAGNOSIS — G301 Alzheimer's disease with late onset: Secondary | ICD-10-CM

## 2016-04-23 DIAGNOSIS — G309 Alzheimer's disease, unspecified: Secondary | ICD-10-CM | POA: Diagnosis not present

## 2016-04-23 DIAGNOSIS — E039 Hypothyroidism, unspecified: Secondary | ICD-10-CM | POA: Diagnosis not present

## 2016-04-23 DIAGNOSIS — G2581 Restless legs syndrome: Secondary | ICD-10-CM

## 2016-04-23 DIAGNOSIS — F0630 Mood disorder due to known physiological condition, unspecified: Secondary | ICD-10-CM

## 2016-04-23 DIAGNOSIS — I10 Essential (primary) hypertension: Secondary | ICD-10-CM | POA: Diagnosis not present

## 2016-04-23 DIAGNOSIS — M069 Rheumatoid arthritis, unspecified: Secondary | ICD-10-CM

## 2016-04-23 DIAGNOSIS — E43 Unspecified severe protein-calorie malnutrition: Secondary | ICD-10-CM

## 2016-04-23 DIAGNOSIS — M353 Polymyalgia rheumatica: Secondary | ICD-10-CM

## 2016-05-28 DIAGNOSIS — G301 Alzheimer's disease with late onset: Secondary | ICD-10-CM | POA: Diagnosis not present

## 2016-05-28 DIAGNOSIS — E441 Mild protein-calorie malnutrition: Secondary | ICD-10-CM | POA: Diagnosis not present

## 2016-05-28 DIAGNOSIS — F39 Unspecified mood [affective] disorder: Secondary | ICD-10-CM

## 2016-05-28 DIAGNOSIS — M06049 Rheumatoid arthritis without rheumatoid factor, unspecified hand: Secondary | ICD-10-CM | POA: Diagnosis not present

## 2016-07-30 DIAGNOSIS — E43 Unspecified severe protein-calorie malnutrition: Secondary | ICD-10-CM

## 2016-07-30 DIAGNOSIS — I10 Essential (primary) hypertension: Secondary | ICD-10-CM | POA: Diagnosis not present

## 2016-07-30 DIAGNOSIS — F39 Unspecified mood [affective] disorder: Secondary | ICD-10-CM | POA: Diagnosis not present

## 2016-07-30 DIAGNOSIS — G2581 Restless legs syndrome: Secondary | ICD-10-CM

## 2016-07-30 DIAGNOSIS — M353 Polymyalgia rheumatica: Secondary | ICD-10-CM

## 2016-07-30 DIAGNOSIS — G309 Alzheimer's disease, unspecified: Secondary | ICD-10-CM | POA: Diagnosis not present

## 2016-07-30 DIAGNOSIS — E039 Hypothyroidism, unspecified: Secondary | ICD-10-CM

## 2016-07-30 DIAGNOSIS — M0500 Felty's syndrome, unspecified site: Secondary | ICD-10-CM

## 2016-09-17 DIAGNOSIS — E440 Moderate protein-calorie malnutrition: Secondary | ICD-10-CM

## 2016-09-17 DIAGNOSIS — F39 Unspecified mood [affective] disorder: Secondary | ICD-10-CM

## 2016-09-17 DIAGNOSIS — G301 Alzheimer's disease with late onset: Secondary | ICD-10-CM | POA: Diagnosis not present

## 2016-09-17 DIAGNOSIS — F29 Unspecified psychosis not due to a substance or known physiological condition: Secondary | ICD-10-CM

## 2016-09-17 DIAGNOSIS — M059 Rheumatoid arthritis with rheumatoid factor, unspecified: Secondary | ICD-10-CM

## 2016-11-26 DIAGNOSIS — E039 Hypothyroidism, unspecified: Secondary | ICD-10-CM | POA: Diagnosis not present

## 2016-11-26 DIAGNOSIS — F39 Unspecified mood [affective] disorder: Secondary | ICD-10-CM | POA: Diagnosis not present

## 2016-11-26 DIAGNOSIS — I10 Essential (primary) hypertension: Secondary | ICD-10-CM

## 2016-11-26 DIAGNOSIS — E43 Unspecified severe protein-calorie malnutrition: Secondary | ICD-10-CM

## 2016-11-26 DIAGNOSIS — M059 Rheumatoid arthritis with rheumatoid factor, unspecified: Secondary | ICD-10-CM | POA: Diagnosis not present

## 2016-11-26 DIAGNOSIS — G309 Alzheimer's disease, unspecified: Secondary | ICD-10-CM

## 2016-12-31 DIAGNOSIS — B351 Tinea unguium: Secondary | ICD-10-CM

## 2017-02-16 DIAGNOSIS — B029 Zoster without complications: Secondary | ICD-10-CM | POA: Diagnosis not present

## 2017-02-18 DIAGNOSIS — B029 Zoster without complications: Secondary | ICD-10-CM

## 2017-02-18 DIAGNOSIS — S0990XA Unspecified injury of head, initial encounter: Secondary | ICD-10-CM

## 2017-02-18 DIAGNOSIS — R443 Hallucinations, unspecified: Secondary | ICD-10-CM

## 2017-03-18 DIAGNOSIS — E039 Hypothyroidism, unspecified: Secondary | ICD-10-CM | POA: Diagnosis not present

## 2017-03-18 DIAGNOSIS — I10 Essential (primary) hypertension: Secondary | ICD-10-CM

## 2017-03-18 DIAGNOSIS — F39 Unspecified mood [affective] disorder: Secondary | ICD-10-CM

## 2017-03-18 DIAGNOSIS — E43 Unspecified severe protein-calorie malnutrition: Secondary | ICD-10-CM

## 2017-03-18 DIAGNOSIS — G309 Alzheimer's disease, unspecified: Secondary | ICD-10-CM

## 2017-03-18 DIAGNOSIS — M05619 Rheumatoid arthritis of unspecified shoulder with involvement of other organs and systems: Secondary | ICD-10-CM

## 2017-03-23 DIAGNOSIS — R609 Edema, unspecified: Secondary | ICD-10-CM

## 2017-04-13 ENCOUNTER — Emergency Department: Payer: Medicare Other

## 2017-04-13 ENCOUNTER — Encounter: Payer: Self-pay | Admitting: Emergency Medicine

## 2017-04-13 ENCOUNTER — Emergency Department
Admission: EM | Admit: 2017-04-13 | Discharge: 2017-04-13 | Disposition: A | Discharge status: Home / Self Care | Payer: Medicare Other | Attending: Emergency Medicine | Admitting: Emergency Medicine

## 2017-04-13 DIAGNOSIS — E039 Hypothyroidism, unspecified: Secondary | ICD-10-CM | POA: Insufficient documentation

## 2017-04-13 DIAGNOSIS — S098XXA Other specified injuries of head, initial encounter: Secondary | ICD-10-CM | POA: Diagnosis present

## 2017-04-13 DIAGNOSIS — Y939 Activity, unspecified: Secondary | ICD-10-CM | POA: Insufficient documentation

## 2017-04-13 DIAGNOSIS — W1830XA Fall on same level, unspecified, initial encounter: Secondary | ICD-10-CM | POA: Insufficient documentation

## 2017-04-13 DIAGNOSIS — Y92129 Unspecified place in nursing home as the place of occurrence of the external cause: Secondary | ICD-10-CM | POA: Diagnosis not present

## 2017-04-13 DIAGNOSIS — Y999 Unspecified external cause status: Secondary | ICD-10-CM | POA: Insufficient documentation

## 2017-04-13 DIAGNOSIS — Z79899 Other long term (current) drug therapy: Secondary | ICD-10-CM | POA: Insufficient documentation

## 2017-04-13 DIAGNOSIS — Y92120 Kitchen in nursing home as the place of occurrence of the external cause: Secondary | ICD-10-CM

## 2017-04-13 DIAGNOSIS — I10 Essential (primary) hypertension: Secondary | ICD-10-CM | POA: Insufficient documentation

## 2017-04-13 DIAGNOSIS — S0101XA Laceration without foreign body of scalp, initial encounter: Secondary | ICD-10-CM | POA: Diagnosis not present

## 2017-04-13 DIAGNOSIS — S0990XA Unspecified injury of head, initial encounter: Secondary | ICD-10-CM

## 2017-04-13 DIAGNOSIS — W19XXXA Unspecified fall, initial encounter: Secondary | ICD-10-CM

## 2017-04-13 NOTE — Discharge Instructions (Signed)
Follow-up with your regular doctor in 10 days to have the staples removed, if you develop any headache or new neurologic symptoms please return to the emergency department, he the area is clean and dry as possible, apply Neosporin once a day, return if any problems

## 2017-04-13 NOTE — ED Triage Notes (Signed)
Patient presents to the ED with staff from [facility] unwitnessed fall at 7pm.  Patient lives in the memory care area called, [facility].  Patient has a small laceration and hematoma to the back of her head.  Patient does not take blood thinners.  Patient is alert and pleasant.

## 2017-04-13 NOTE — ED Provider Notes (Signed)
[facility] Emergency Department Provider Note  ____________________________________________   First MD Initiated Contact with Patient 04/13/17 1123     (approximate)  I have reviewed the triage vital signs and the nursing notes.   HISTORY  Chief Complaint Fall    HPI Kristen Church is a 83 y.o. female states she fell last night and hit her head, caregiver states it happened around 7 PM, no new neurologic deficits have been noted, the patient has a laceration to her scalp, denies vomiting or diarrhea   Past Medical History:  Diagnosis Date  . HLD (hyperlipidemia)   . Hypertension   . Hypothyroidism   . Osteoarthritis   . Osteoporosis, post-menopausal   . PMR (polymyalgia rheumatica) ([place])   . PVD (peripheral vascular disease) ([place])   . Renal artery stenosis [facility])     Patient Active Problem List   Diagnosis Date Noted  . General weakness 11/18/2015  . Malignant essential hypertension 11/18/2015  . Pneumonia 11/18/2015  . Dehydration 01/20/2015  . Sigmoid diverticulitis 01/19/2015  . Pancreatic cyst 01/19/2015  . HTN (hypertension) 01/19/2015  . PMR (polymyalgia rheumatica) ([facility]) 01/19/2015  . Hypothyroidism 01/19/2015  . Osteoarthritis 01/19/2015  . Right lower quadrant abdominal pain     Past Surgical History:  Procedure Laterality Date  . BREAST BIOPSY Right    benign  . ELBOW SURGERY Left   . JOINT REPLACEMENT Right    1995  . JOINT REPLACEMENT Left    2005  . TEMPORAL ARTERY BIOPSY / LIGATION    . TONSILLECTOMY      Prior to Admission medications   Medication Sig Start Date End Date Taking? Authorizing Provider  amLODipine (NORVASC) 10 MG tablet Take 1 tablet (10 mg total) by mouth daily. 11/20/15   Dustin Flock, MD  ferrous sulfate 325 (65 FE) MG tablet Take 325 mg by mouth daily.    [provider]  folic acid (FOLVITE) 1 MG tablet Take 1 mg by mouth daily.  01/06/15   [provider]  hydroxychloroquine  (PLAQUENIL) 200 MG tablet Take 400 mg by mouth daily.  11/28/14   [provider]  levothyroxine (SYNTHROID, LEVOTHROID) 100 MCG tablet Take 1 tablet (100 mcg total) by mouth daily before breakfast. 11/20/15   Dustin Flock, MD  methotrexate (RHEUMATREX) 2.5 MG tablet take 6 tablets by mouth EVERY WEEK 03/12/15   [provider]  nebivolol (BYSTOLIC) 5 MG tablet Take 5 mg by mouth daily.    [provider]  pramipexole (MIRAPEX) 0.25 MG tablet Take 0.25 mg by mouth at bedtime.  03/17/15   [provider]  predniSONE (DELTASONE) 5 MG tablet Take 2.5 mg by mouth every other day.  12/30/14   [provider]  traMADol (ULTRAM) 50 MG tablet Take 1 tablet (50 mg total) by mouth 4 (four) times daily as needed for moderate pain or severe pain. 11/20/15   Dustin Flock, MD  traZODone (DESYREL) 50 MG tablet Take 50 mg by mouth at bedtime. 07/17/15 07/16/16  [provider]    Allergies Celebrex [celecoxib]; Macrobid [nitrofurantoin monohyd macro]; Neurontin [gabapentin]; Sulfa antibiotics; Other; and Sulfamethoxazole  Family History  Problem Relation Age of Onset  . CAD Mother   . Stroke Mother   . Peripheral vascular disease Father   . Ulcers Father   . Emphysema Father   . Cancer Neg Hx   . Diabetes Neg Hx   . Ovarian cancer Neg Hx     Social History Social History  Tobacco Use  . Smoking status: Never Smoker  . Smokeless tobacco: Never Used  Substance Use Topics  . Alcohol use: No  . Drug use: No    Review of Systems  Constitutional: No fever/chills, no headache Eyes: No visual changes. ENT: No sore throat. Respiratory: Denies cough Genitourinary: Negative for dysuria. Musculoskeletal: Negative for back pain. Skin: Negative for rash. Positive laceration    ____________________________________________   PHYSICAL EXAM:  VITAL SIGNS: ED Triage Vitals [04/13/17 1026]  Enc Vitals Group     BP (!) 159/55     Pulse Rate 79      Resp 16     Temp 97.8 F (36.6 C)     Temp Source Oral     SpO2 99 %     Weight 90 lb (40.8 kg)     Height 4\' 11"  (1.499 m)     Head Circumference      Peak Flow      Pain Score 0     Pain Loc      Pain Edu?      Excl. in [place]?    Constitutional: Alert and oriented. Well appearing and in no acute distress. Eyes: Conjunctivae are normal.  Head: Stiff for a bruise and a 1 cm laceration to the scalp, at the posterior of the head Nose: No congestion/rhinnorhea. Mouth/Throat: Mucous membranes are moist.   Cardiovascular: Normal rate, regular rhythm. Respiratory: Normal respiratory effort.  No retractions GU: deferred Musculoskeletal: FROM all extremities, warm and well perfused Neurologic:  Normal speech and language.  Skin:  Skin is warm, dry , 1 cm laceration noted on scalp Psychiatric: Mood and affect are normal. Speech and behavior are normal.  ____________________________________________   LABS (all labs ordered are listed, but only abnormal results are displayed)  Labs Reviewed - No data to display ____________________________________________   ____________________________________________  RADIOLOGY   CT of head is negative ____________________________________________   PROCEDURES  Procedure(s) performed: Area cleaned with normal saline, wound edges were approximated with 2 staples, patient tolerated procedure well, neurovascular is intact      ____________________________________________   INITIAL IMPRESSION / ASSESSMENT AND PLAN / ED COURSE  Pertinent labs & imaging results that were available during my care of the patient were reviewed by me and considered in my medical decision making (see chart for details).  Patient is accompanied by caregiver from [facility] [employer], they states she fell last night around 7 PM, CT of the head is negative. Gallop laceration was noted, applied 2 staples to the posterior scalp, patient does not show any other injuries at this  time, they were instructed to follow-up with her regular doctor for staple removal in 10 days, keep the area clean and dry, they can apply Neosporin once a day as needed, return to the emergency room if any new neurologic problems      ____________________________________________   FINAL CLINICAL IMPRESSION(S) / ED DIAGNOSES  Final diagnoses:  Fall, initial encounter  Minor head injury, initial encounter  Laceration of scalp, initial encounter      NEW MEDICATIONS STARTED DURING THIS VISIT:  This SmartLink is deprecated. Use AVSMEDLIST instead to display the medication list for a patient.   Note:  This document was prepared using Dragon voice recognition software and may include unintentional dictation errors.    Versie Starks, PA-C 04/13/17 1140    Arta Silence, MD 04/13/17 1308

## 2017-06-02 DIAGNOSIS — E441 Mild protein-calorie malnutrition: Secondary | ICD-10-CM

## 2017-06-02 DIAGNOSIS — F39 Unspecified mood [affective] disorder: Secondary | ICD-10-CM

## 2017-06-02 DIAGNOSIS — M069 Rheumatoid arthritis, unspecified: Secondary | ICD-10-CM | POA: Diagnosis not present

## 2017-06-02 DIAGNOSIS — R443 Hallucinations, unspecified: Secondary | ICD-10-CM | POA: Diagnosis not present

## 2017-06-02 DIAGNOSIS — G301 Alzheimer's disease with late onset: Secondary | ICD-10-CM | POA: Diagnosis not present

## 2017-07-20 DIAGNOSIS — R441 Visual hallucinations: Secondary | ICD-10-CM | POA: Diagnosis not present

## 2017-07-20 DIAGNOSIS — I10 Essential (primary) hypertension: Secondary | ICD-10-CM | POA: Diagnosis not present

## 2017-07-20 DIAGNOSIS — E039 Hypothyroidism, unspecified: Secondary | ICD-10-CM | POA: Diagnosis not present

## 2017-07-20 DIAGNOSIS — E43 Unspecified severe protein-calorie malnutrition: Secondary | ICD-10-CM

## 2017-07-20 DIAGNOSIS — F39 Unspecified mood [affective] disorder: Secondary | ICD-10-CM | POA: Diagnosis not present

## 2017-07-20 DIAGNOSIS — G309 Alzheimer's disease, unspecified: Secondary | ICD-10-CM | POA: Diagnosis not present

## 2017-07-20 DIAGNOSIS — M1990 Unspecified osteoarthritis, unspecified site: Secondary | ICD-10-CM

## 2017-09-21 DIAGNOSIS — B001 Herpesviral vesicular dermatitis: Secondary | ICD-10-CM | POA: Diagnosis not present

## 2017-09-23 DIAGNOSIS — G301 Alzheimer's disease with late onset: Secondary | ICD-10-CM | POA: Diagnosis not present

## 2017-09-23 DIAGNOSIS — R441 Visual hallucinations: Secondary | ICD-10-CM | POA: Diagnosis not present

## 2017-09-23 DIAGNOSIS — M069 Rheumatoid arthritis, unspecified: Secondary | ICD-10-CM | POA: Diagnosis not present

## 2017-09-23 DIAGNOSIS — F39 Unspecified mood [affective] disorder: Secondary | ICD-10-CM

## 2017-11-23 DIAGNOSIS — G309 Alzheimer's disease, unspecified: Secondary | ICD-10-CM | POA: Diagnosis not present

## 2017-11-23 DIAGNOSIS — E039 Hypothyroidism, unspecified: Secondary | ICD-10-CM

## 2017-11-23 DIAGNOSIS — M059 Rheumatoid arthritis with rheumatoid factor, unspecified: Secondary | ICD-10-CM | POA: Diagnosis not present

## 2017-11-23 DIAGNOSIS — F39 Unspecified mood [affective] disorder: Secondary | ICD-10-CM | POA: Diagnosis not present

## 2017-11-23 DIAGNOSIS — I10 Essential (primary) hypertension: Secondary | ICD-10-CM

## 2017-11-23 DIAGNOSIS — E43 Unspecified severe protein-calorie malnutrition: Secondary | ICD-10-CM

## 2017-11-23 DIAGNOSIS — R441 Visual hallucinations: Secondary | ICD-10-CM | POA: Diagnosis not present

## 2018-01-12 DIAGNOSIS — L570 Actinic keratosis: Secondary | ICD-10-CM | POA: Diagnosis not present

## 2018-01-26 DIAGNOSIS — M7500 Adhesive capsulitis of unspecified shoulder: Secondary | ICD-10-CM

## 2018-02-02 DIAGNOSIS — F39 Unspecified mood [affective] disorder: Secondary | ICD-10-CM | POA: Diagnosis not present

## 2018-02-02 DIAGNOSIS — M19011 Primary osteoarthritis, right shoulder: Secondary | ICD-10-CM

## 2018-02-02 DIAGNOSIS — G301 Alzheimer's disease with late onset: Secondary | ICD-10-CM

## 2018-02-02 DIAGNOSIS — M159 Polyosteoarthritis, unspecified: Secondary | ICD-10-CM | POA: Diagnosis not present

## 2018-02-02 DIAGNOSIS — M069 Rheumatoid arthritis, unspecified: Secondary | ICD-10-CM

## 2018-03-22 DIAGNOSIS — M069 Rheumatoid arthritis, unspecified: Secondary | ICD-10-CM

## 2018-03-22 DIAGNOSIS — E039 Hypothyroidism, unspecified: Secondary | ICD-10-CM | POA: Diagnosis not present

## 2018-03-22 DIAGNOSIS — E43 Unspecified severe protein-calorie malnutrition: Secondary | ICD-10-CM

## 2018-03-22 DIAGNOSIS — I10 Essential (primary) hypertension: Secondary | ICD-10-CM

## 2018-03-22 DIAGNOSIS — R441 Visual hallucinations: Secondary | ICD-10-CM

## 2018-03-22 DIAGNOSIS — F39 Unspecified mood [affective] disorder: Secondary | ICD-10-CM | POA: Diagnosis not present

## 2018-03-22 DIAGNOSIS — G309 Alzheimer's disease, unspecified: Secondary | ICD-10-CM | POA: Diagnosis not present

## 2018-06-02 DIAGNOSIS — R441 Visual hallucinations: Secondary | ICD-10-CM | POA: Diagnosis not present

## 2018-06-02 DIAGNOSIS — F39 Unspecified mood [affective] disorder: Secondary | ICD-10-CM

## 2018-06-02 DIAGNOSIS — I10 Essential (primary) hypertension: Secondary | ICD-10-CM

## 2018-06-02 DIAGNOSIS — E441 Mild protein-calorie malnutrition: Secondary | ICD-10-CM

## 2018-06-02 DIAGNOSIS — G301 Alzheimer's disease with late onset: Secondary | ICD-10-CM

## 2018-06-09 IMAGING — CT CT CHEST W/O CM
2 of 3 series · 16 of 46 positions shown, 18 images · non-contrast
Comparison: Current chest radiograph

CLINICAL DATA: Multiple medical problems including essential
hypertension, polymyalgia rheumatica, peripheral vascular disease,
renal artery stenosis, osteoporosis, arthritis, hyperlipidemia,
osteoporosis, who presents to the hospital with complaints of
weakness, generalized in nature. The patient says she was doing well
up until today morning when she woke up she noted that she is very
weak and decided to come to emergency room for further evaluation.
According to EMS, patient had some increasing confusion as well. She
complained of right shoulder pain which was worse with movement, but
denied any trauma.

EXAM:
CT CHEST WITHOUT CONTRAST
TECHNIQUE: Multidetector CT imaging of the chest was performed following the
standard protocol without IV contrast.

[Series 2: routine chest wo · axial · 0.61mm/px · z∈[-768,-508]mm · 13 of 60 slices shown, 15 images]
[im 4/60  soft-tissue]
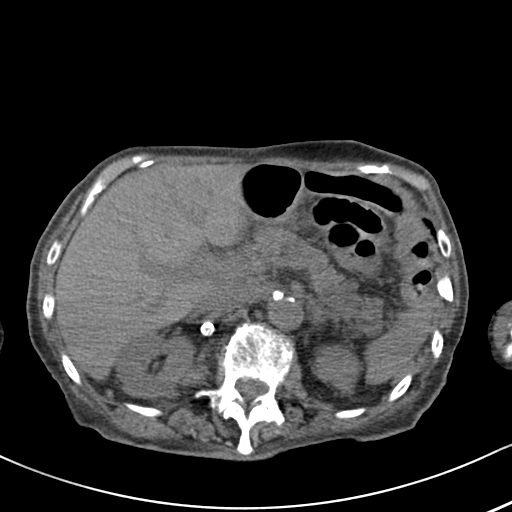
[im 4/60  bone]
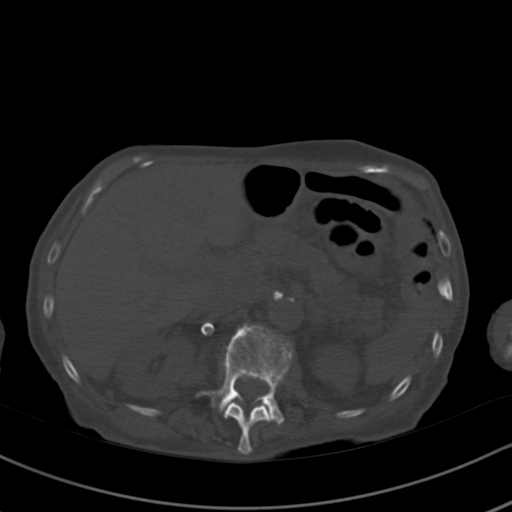
[im 8/60  soft-tissue]
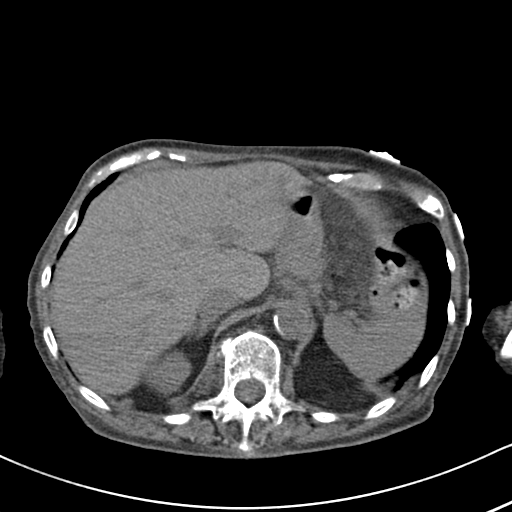
[im 12/60  soft-tissue]
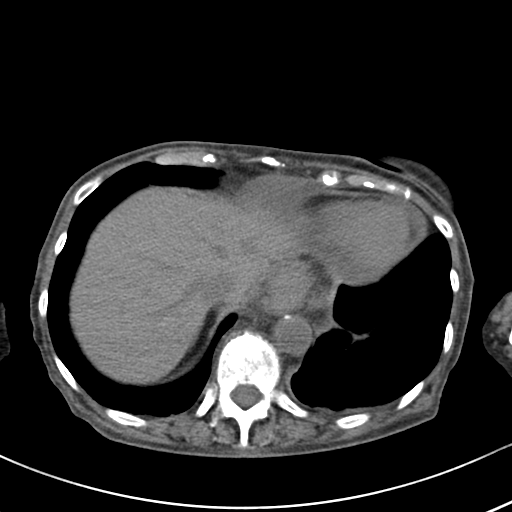
[im 18/60  soft-tissue]
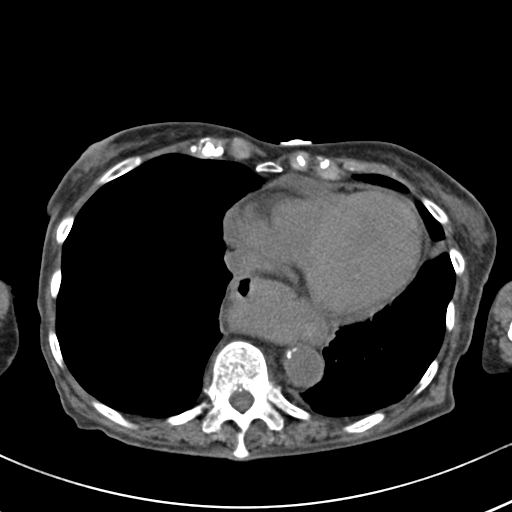
[im 21/60  soft-tissue]
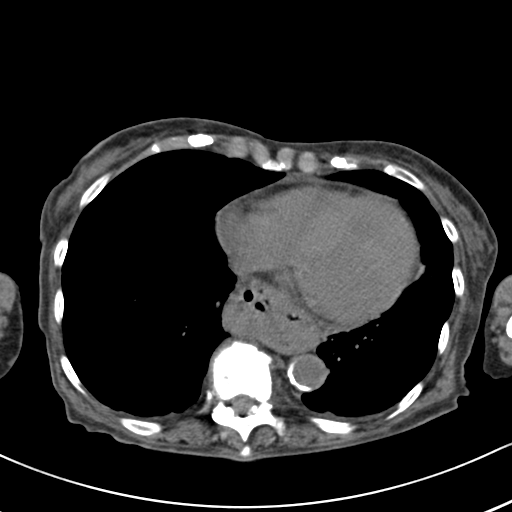
[im 25/60  soft-tissue]
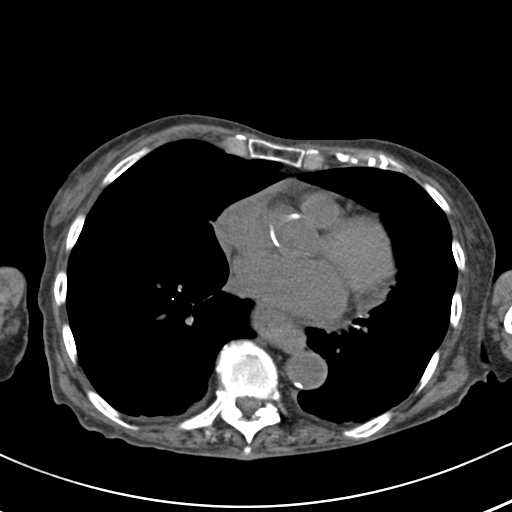
[im 31/60  soft-tissue]
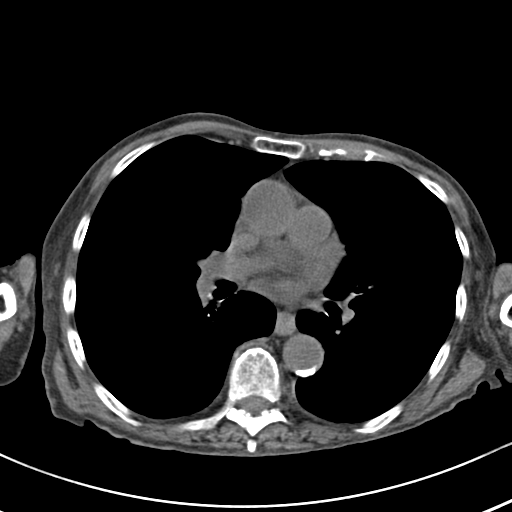
[im 35/60  soft-tissue]
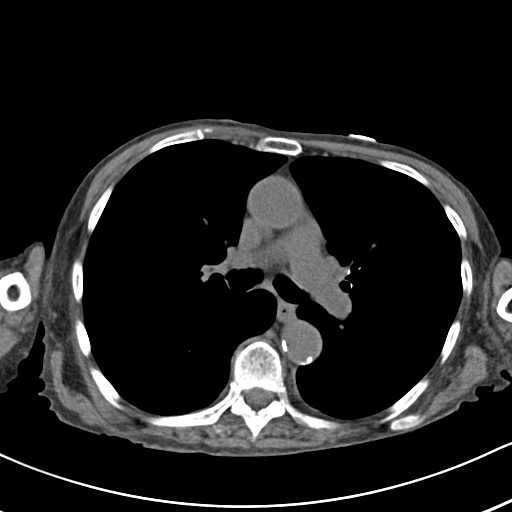
[im 39/60  soft-tissue]
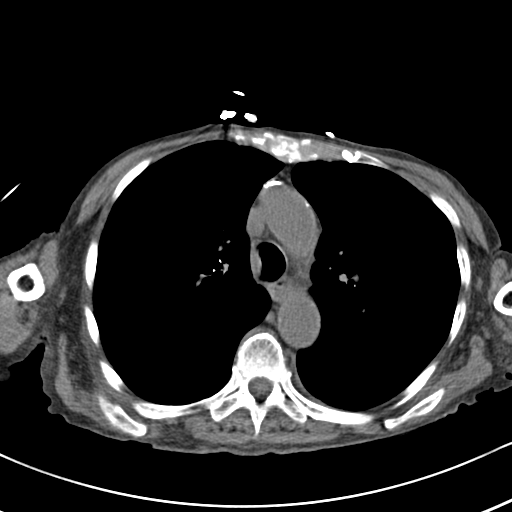
[im 39/60  bone]
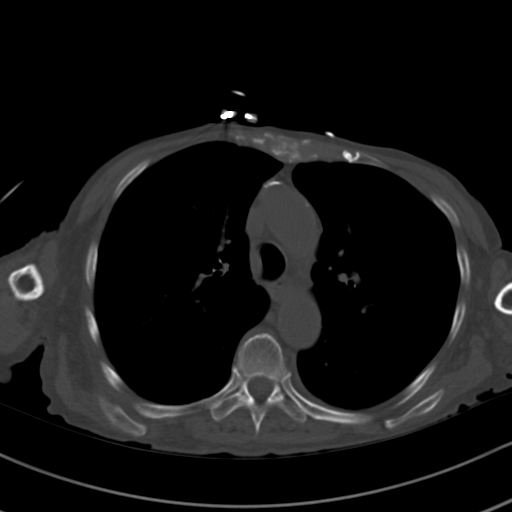
[im 42/60  soft-tissue]
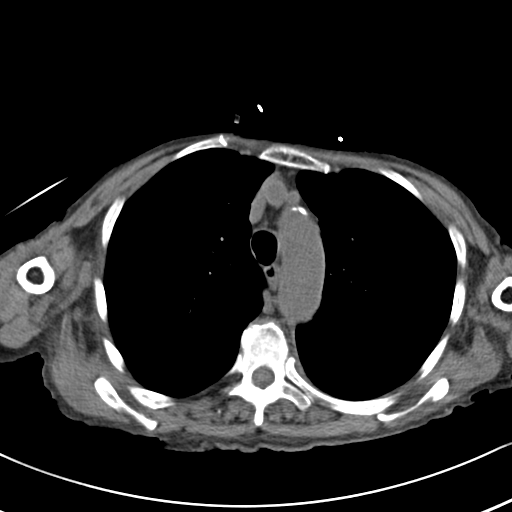
[im 48/60  soft-tissue]
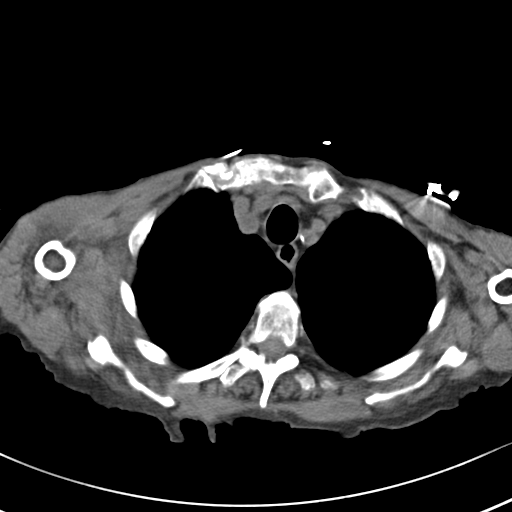
[im 52/60  soft-tissue]
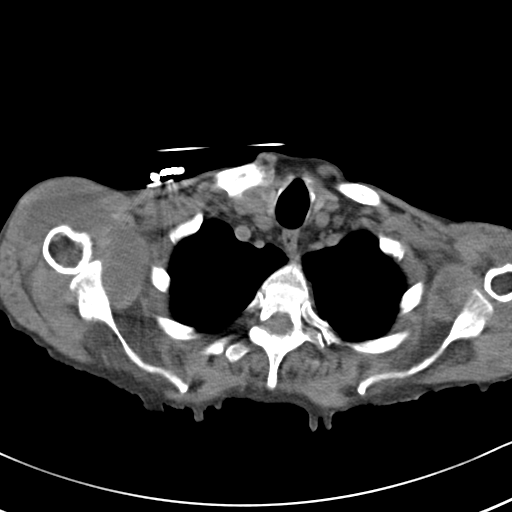
[im 56/60  soft-tissue]
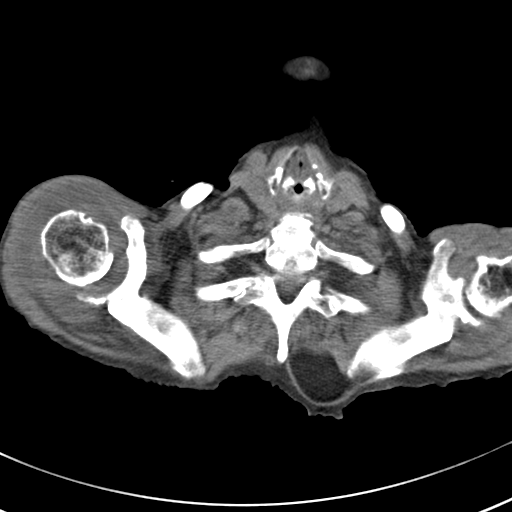

[Series 5: routine chest wo cor · coronal · 0.71mm/px · 3 of 109 slices shown]
[im 37/109  soft-tissue]
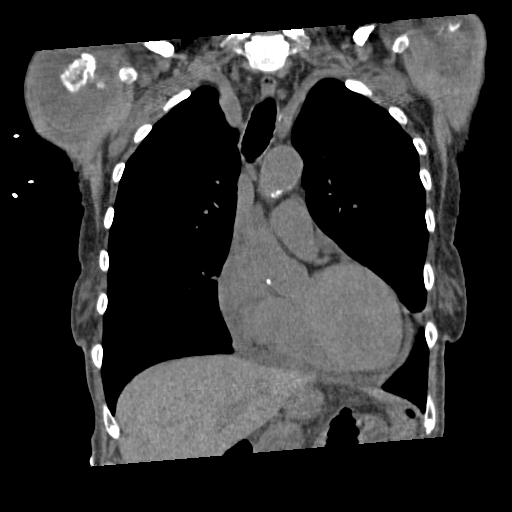
[im 49/109  soft-tissue]
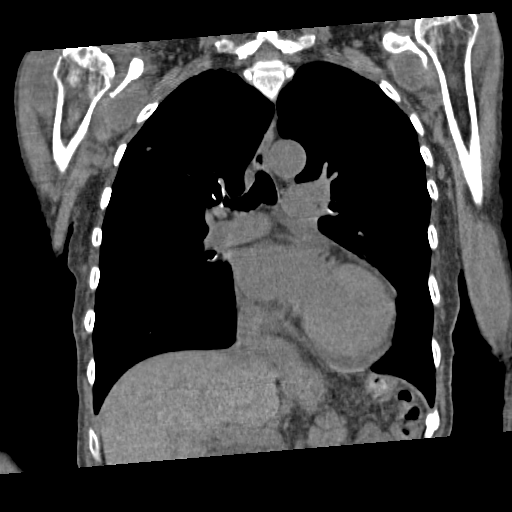
[im 61/109  soft-tissue]
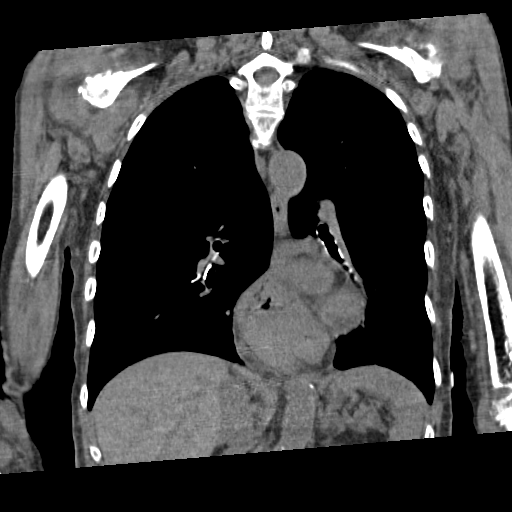

[16 of 46 positions shown; findings below may reference images not displayed]

FINDINGS: Cardiovascular: The heart is normal in size. Small pericardial
effusion. No significant coronary artery calcifications. Aorta is
normal caliber. Scattered atherosclerotic calcifications noted along
the thoracic aorta and its branch vessels.

Mediastinum/Nodes: Moderate size hiatal hernia. No mediastinal or
hilar masses or pathologically enlarged lymph nodes.

Lungs/Pleura: In the right upper lobe there is a 5.5 x 6.5 mm nodule
(6 mm mean diameter) on image 39 of series 3. 2 mm nodule in the
anterior inferior right upper lobe near the minor fissure, image 82.
Minimal dependent subsegmental atelectasis mostly in the lower lobes
at the posterior lung bases. Lungs otherwise clear. No pleural
effusion. No pneumothorax.

Upper Abdomen: No acute finding.

Musculoskeletal: No fracture. No bone lesion. Arthropathic changes
of the shoulders, greater on the right. There are degenerative
changes of the thoracic spine. Bones are demineralized.
IMPRESSION: 1. No acute findings.
2. 6 mm right upper lobe nodule with a second, 2 mm right upper lobe
nodule. Non-contrast chest CT at 3-6 months is recommended. If the
nodules are stable at time of repeat CT, then future CT at 18-24
months (from today's scan) is considered optional for low-risk
patients, but is recommended for high-risk patients. This
recommendation follows the consensus statement: Guidelines for
Management of Incidental Pulmonary Nodules Detected on CT
Images:From the [HOSPITAL] 1210; published online before
print (10.1148/radiol.1452515122).

## 2018-06-09 IMAGING — DX DG SHOULDER 2+V*R*
3 series · 3 of 3 positions shown · non-contrast
Comparison: None.

CLINICAL DATA: Stroke-like symptoms

EXAM:
RIGHT SHOULDER - 2+ VIEW

[shoulder ap (1 of 2)]
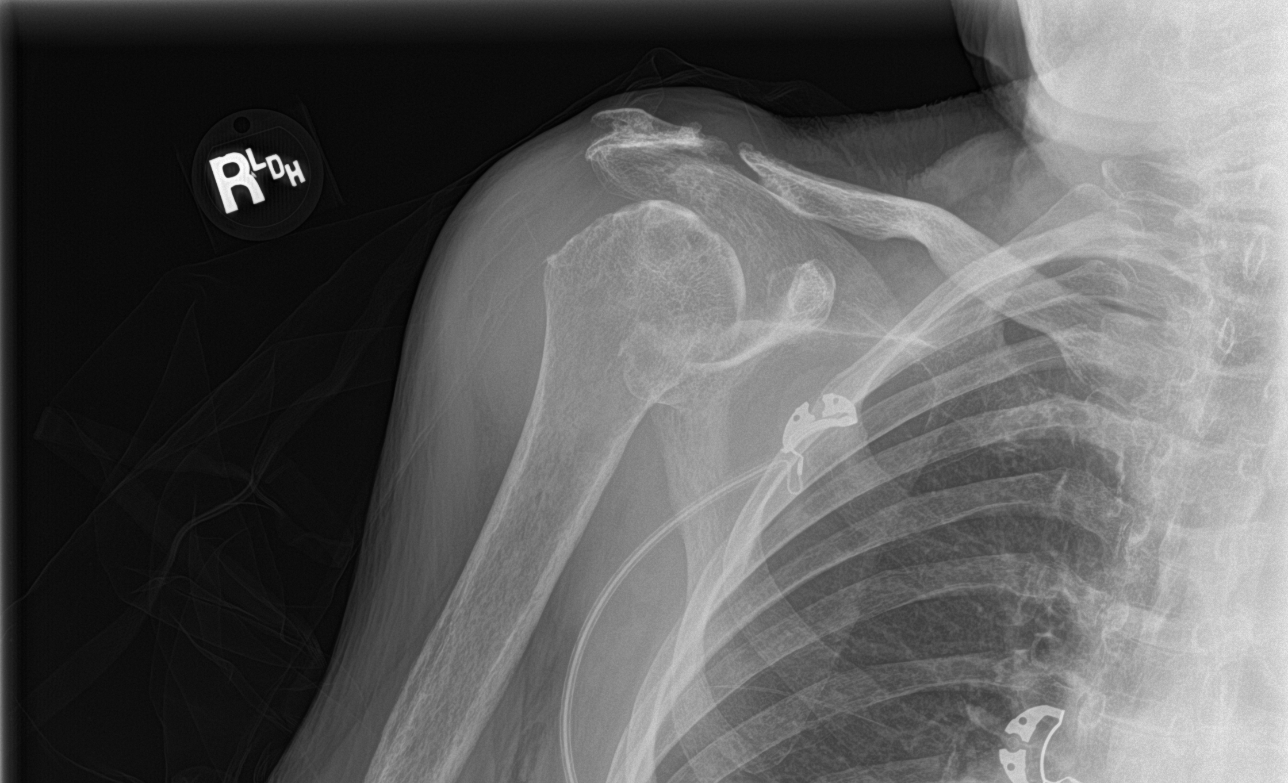

[shoulder obl]
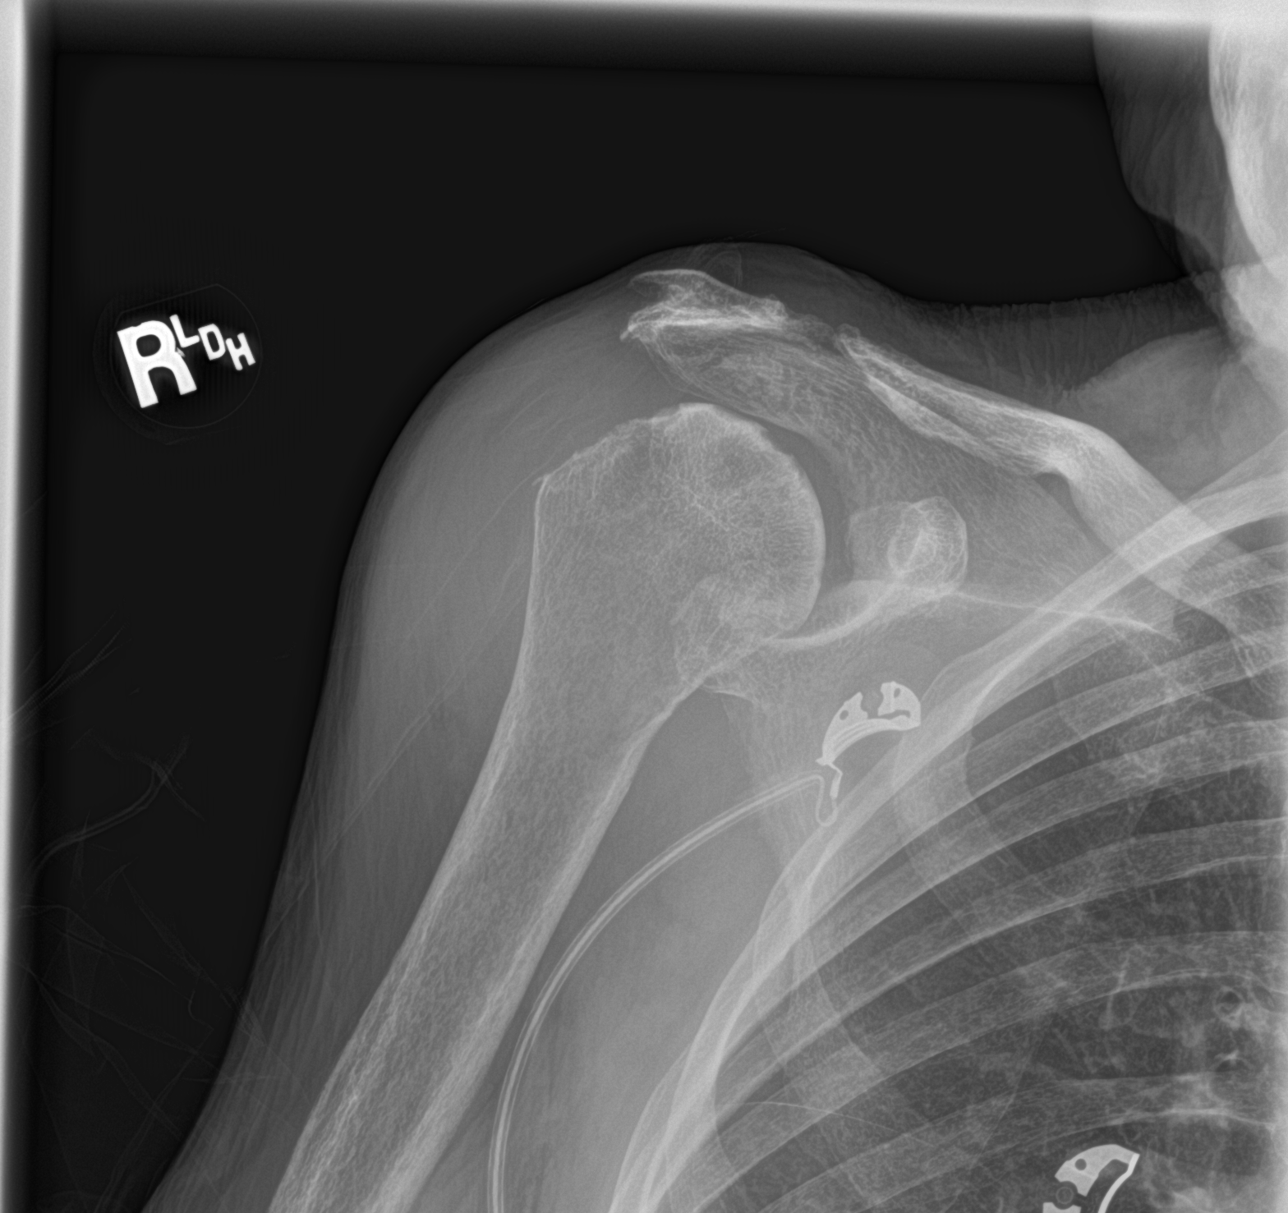

[shoulder ap (2 of 2)]
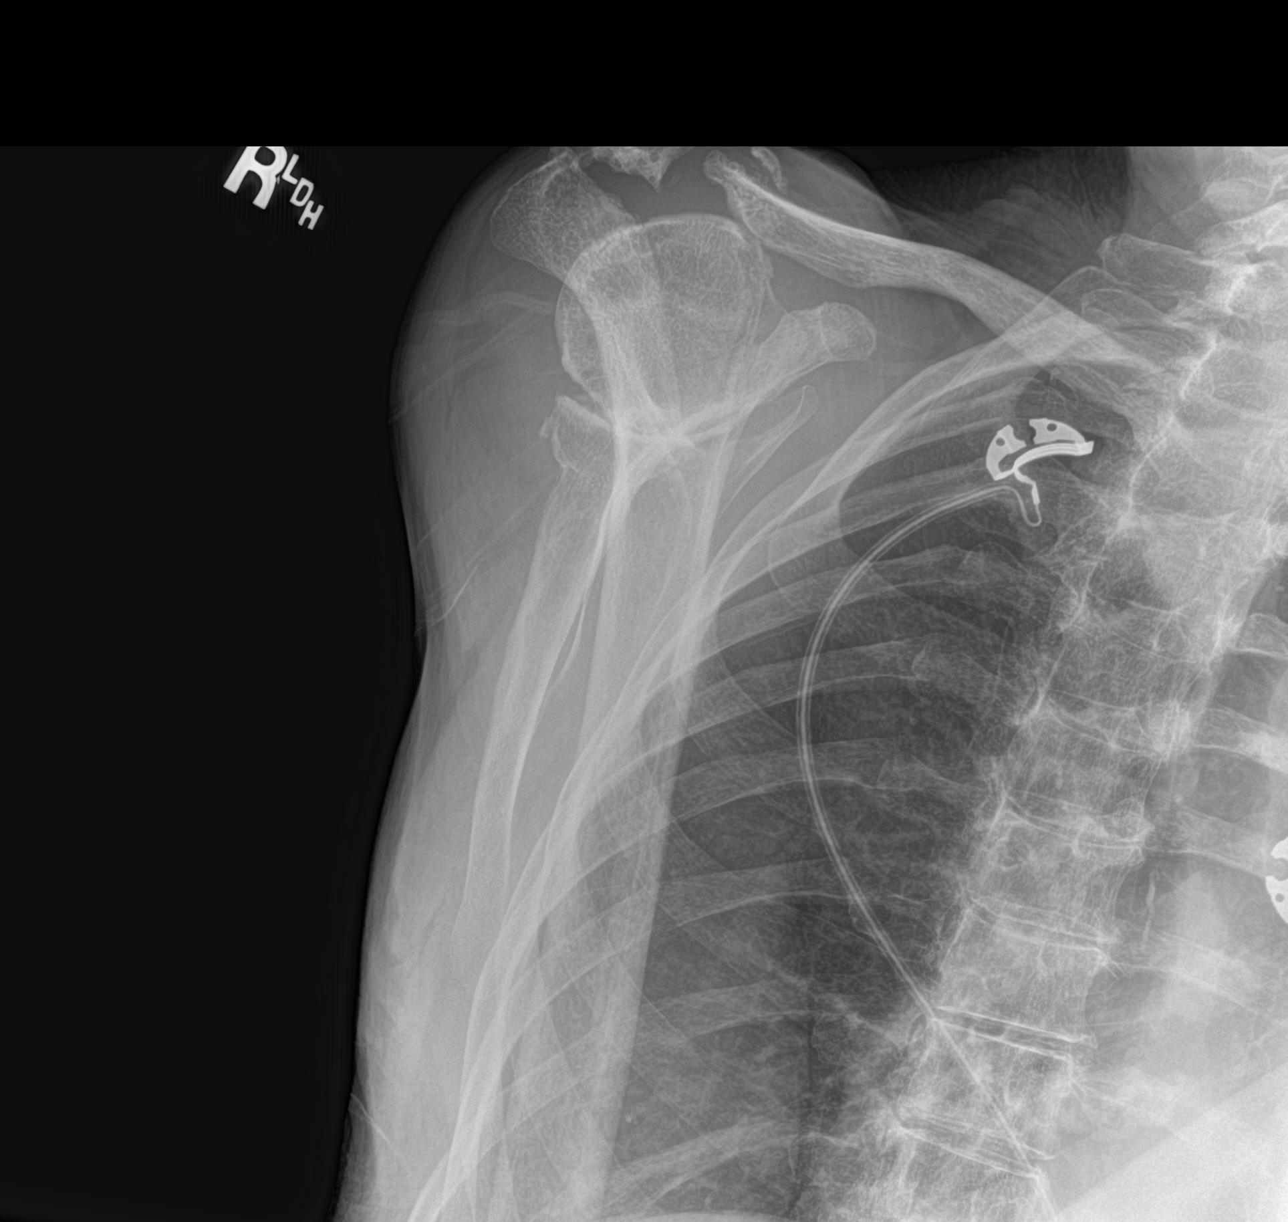

[3 of 3 positions shown; findings below may reference images not displayed]

FINDINGS: No acute fracture. No dislocation. Unremarkable soft tissues.
Postoperative changes involving the peripheral clavicle and acromion
on are noted.
IMPRESSION: No acute bony pathology. Chronic and postoperative changes are
noted.

## 2018-07-19 DIAGNOSIS — I10 Essential (primary) hypertension: Secondary | ICD-10-CM | POA: Diagnosis not present

## 2018-07-19 DIAGNOSIS — G309 Alzheimer's disease, unspecified: Secondary | ICD-10-CM

## 2018-07-19 DIAGNOSIS — E039 Hypothyroidism, unspecified: Secondary | ICD-10-CM

## 2018-07-19 DIAGNOSIS — E43 Unspecified severe protein-calorie malnutrition: Secondary | ICD-10-CM

## 2018-07-19 DIAGNOSIS — R441 Visual hallucinations: Secondary | ICD-10-CM

## 2018-07-19 DIAGNOSIS — M069 Rheumatoid arthritis, unspecified: Secondary | ICD-10-CM

## 2018-07-19 DIAGNOSIS — F39 Unspecified mood [affective] disorder: Secondary | ICD-10-CM

## 2018-08-14 DIAGNOSIS — K140 Glossitis: Secondary | ICD-10-CM | POA: Diagnosis not present

## 2018-09-21 DIAGNOSIS — F39 Unspecified mood [affective] disorder: Secondary | ICD-10-CM | POA: Diagnosis not present

## 2018-09-21 DIAGNOSIS — I10 Essential (primary) hypertension: Secondary | ICD-10-CM

## 2018-09-21 DIAGNOSIS — G301 Alzheimer's disease with late onset: Secondary | ICD-10-CM | POA: Diagnosis not present

## 2018-09-21 DIAGNOSIS — N183 Chronic kidney disease, stage 3 (moderate): Secondary | ICD-10-CM

## 2018-09-21 DIAGNOSIS — E440 Moderate protein-calorie malnutrition: Secondary | ICD-10-CM

## 2018-09-21 DIAGNOSIS — M069 Rheumatoid arthritis, unspecified: Secondary | ICD-10-CM | POA: Diagnosis not present

## 2018-10-19 ENCOUNTER — Telehealth: Payer: Self-pay | Admitting: *Deleted

## 2018-10-19 NOTE — Telephone Encounter (Addendum)
Called patients POA to set up appt.   He will call back to set it up.  He wants to speak with [facility] and he is unsure the patient could tolerate radiation at this time.   He stated he will call back in next few days.  The number to the nurses desk was given.

## 2018-10-25 NOTE — Telephone Encounter (Signed)
Please request records from Dr Nehemiah Massed (der[facility]) about this patient. He was supposed to send it to me but I have not received it.

## 2018-10-26 ENCOUNTER — Institutional Professional Consult (permissible substitution): Payer: Medicare Other | Admitting: Radiation Oncology

## 2018-11-22 DIAGNOSIS — M069 Rheumatoid arthritis, unspecified: Secondary | ICD-10-CM

## 2018-11-22 DIAGNOSIS — R441 Visual hallucinations: Secondary | ICD-10-CM

## 2018-11-22 DIAGNOSIS — E43 Unspecified severe protein-calorie malnutrition: Secondary | ICD-10-CM

## 2018-11-22 DIAGNOSIS — F39 Unspecified mood [affective] disorder: Secondary | ICD-10-CM

## 2018-11-22 DIAGNOSIS — E039 Hypothyroidism, unspecified: Secondary | ICD-10-CM

## 2018-11-22 DIAGNOSIS — I10 Essential (primary) hypertension: Secondary | ICD-10-CM

## 2018-11-22 DIAGNOSIS — G309 Alzheimer's disease, unspecified: Secondary | ICD-10-CM | POA: Diagnosis not present

## 2018-12-04 DIAGNOSIS — M19011 Primary osteoarthritis, right shoulder: Secondary | ICD-10-CM

## 2018-12-04 DIAGNOSIS — C44329 Squamous cell carcinoma of skin of other parts of face: Secondary | ICD-10-CM

## 2018-12-04 DIAGNOSIS — S5011XA Contusion of right forearm, initial encounter: Secondary | ICD-10-CM

## 2018-12-13 ENCOUNTER — Encounter: Payer: Self-pay | Admitting: Radiation Oncology

## 2018-12-13 ENCOUNTER — Ambulatory Visit
Admission: RE | Admit: 2018-12-13 | Discharge: 2018-12-13 | Disposition: A | Discharge status: Home / Self Care | Payer: Medicare Other | Source: Ambulatory Visit | Attending: Radiation Oncology | Admitting: Radiation Oncology

## 2018-12-13 ENCOUNTER — Other Ambulatory Visit: Payer: Self-pay | Admitting: *Deleted

## 2018-12-13 ENCOUNTER — Other Ambulatory Visit: Payer: Self-pay

## 2018-12-13 DIAGNOSIS — C44309 Unspecified malignant neoplasm of skin of other parts of face: Secondary | ICD-10-CM

## 2018-12-13 NOTE — Consult Note (Signed)
NEW PATIENT EVALUATION  Name: Kristen Church  MRN: [id number]  Date:   12/13/2018     DOB: [birth year]   This 85 y.o. female patient presents to the clinic for initial evaluation of squamous cell carcinoma the right temple.  REFERRING PHYSICIAN: Venia Carbon, MD  CHIEF COMPLAINT:  Chief Complaint  Patient presents with  . Skin Cancer    Initial Eval    DIAGNOSIS: There were no encounter diagnoses.   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  Patient has multiple comorbidities including essential hypertension polymyalgia rheumatica peripheral vascular disease renal artery stenosis hyperlipidemia.  She is seen today for consideration of treatment to this area.  She is having no pain or discomfort no change in her visual fields.  PLANNED TREATMENT REGIMEN: Electron-beam radiation therapy  PAST MEDICAL HISTORY:  has a past medical history of HLD (hyperlipidemia), Hypertension, Hypothyroidism, Osteoarthritis, Osteoporosis, post-menopausal, PMR (polymyalgia rheumatica) ([facility]), PVD (peripheral vascular disease) ([place]), and Renal artery stenosis ([place]).    PAST SURGICAL HISTORY:  Past Surgical History:  Procedure Laterality Date  . BREAST BIOPSY Right    benign  . ELBOW SURGERY Left   . JOINT REPLACEMENT Right    1995  . JOINT REPLACEMENT Left    2005  . TEMPORAL ARTERY BIOPSY / LIGATION    . TONSILLECTOMY      FAMILY HISTORY: family history includes CAD in her mother; Emphysema in her father; Peripheral vascular disease in her father; Stroke in her mother; Ulcers in her father.  SOCIAL HISTORY:  reports that she has never smoked. She has never used smokeless tobacco. She reports that she does not drink alcohol or use drugs.  ALLERGIES: Celebrex [celecoxib], Macrobid  [nitrofurantoin monohyd macro], Neurontin [gabapentin], Sulfa antibiotics, Other, and Sulfamethoxazole  MEDICATIONS:  Current Outpatient Medications  Medication Sig Dispense Refill  . amLODipine (NORVASC) 10 MG tablet Take 1 tablet (10 mg total) by mouth daily.    . ferrous sulfate 325 (65 FE) MG tablet Take 325 mg by mouth daily.    . folic acid (FOLVITE) 1 MG tablet Take 1 mg by mouth daily.   1  . hydroxychloroquine (PLAQUENIL) 200 MG tablet Take 400 mg by mouth daily.   0  . levothyroxine (SYNTHROID, LEVOTHROID) 100 MCG tablet Take 1 tablet (100 mcg total) by mouth daily before breakfast.    . methotrexate (RHEUMATREX) 2.5 MG tablet take 6 tablets by mouth EVERY WEEK    . nebivolol (BYSTOLIC) 5 MG tablet Take 5 mg by mouth daily.    . pramipexole (MIRAPEX) 0.25 MG tablet Take 0.25 mg by mouth at bedtime.   0  . predniSONE (DELTASONE) 5 MG tablet Take 2.5 mg by mouth every other day.   0  . traMADol (ULTRAM) 50 MG tablet Take 1 tablet (50 mg total) by mouth 4 (four) times daily as needed for moderate pain or severe pain. 30 tablet 0  . traZODone (DESYREL) 50 MG tablet Take 50 mg by mouth at bedtime.     No current facility-administered medications for this encounter.     ECOG PERFORMANCE STATUS:  0 - Asymptomatic  REVIEW OF SYSTEMS: Patient denies any weight loss, fatigue, weakness, fever, chills or night sweats. Patient denies any loss of vision, blurred vision. Patient denies any ringing  of the ears or hearing loss. No irregular heartbeat.  Patient denies heart murmur or history of fainting. Patient denies any chest pain or pain radiating to her upper extremities. Patient denies any shortness of breath, difficulty breathing at night, cough or hemoptysis. Patient denies any swelling in the lower legs. Patient denies any nausea vomiting, vomiting of blood, or coffee ground material in the vomitus. Patient denies any stomach pain. Patient states has had normal bowel movements no significant  constipation or diarrhea. Patient denies any dysuria, hematuria or significant nocturia. Patient denies any problems walking, swelling in the joints or loss of balance. Patient denies any skin changes, loss of hair or loss of weight. Patient denies any excessive worrying or anxiety or significant depression. Patient denies any problems with insomnia. Patient denies excessive thirst, polyuria, polydipsia. Patient denies any swollen glands, patient denies easy bruising or easy bleeding. Patient denies any recent infections, allergies or URI. Patient "s visual fields have not changed significantly in recent time.   PHYSICAL EXAM: There were no vitals taken for this visit. There is approximately 2 cm lesion in the right temple protruding no evidence of skin or ulceration noted.  No evidence of cervical or supraclavicular adenopathy.  Well-developed well-nourished patient in NAD. HEENT reveals PERLA, EOMI, discs not visualized.  Oral cavity is clear. No oral mucosal lesions are identified. Neck is clear without evidence of cervical or supraclavicular adenopathy. Lungs are clear to A&P. Cardiac examination is essentially unremarkable with regular rate and rhythm without murmur rub or thrill. Abdomen is benign with no organomegaly or masses noted. Motor sensory and DTR levels are equal and symmetric in the upper and lower extremities. Cranial nerves II through XII are grossly intact. Proprioception is intact. No peripheral adenopathy or edema is identified. No motor or sensory levels are noted. Crude visual fields are within normal range.  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  Would plan on delivering 5000 cGy over 5 weeks.  Risks  and benefits of treatment cleaning skin reaction fatigue all were discussed with the patient.  She seems to comprehend my treatment plan well.  I personally set up and ordered CT simulation for next week.  I would like to take this opportunity to thank you for allowing me to participate in the care of your patient.Noreene Filbert, MD

## 2018-12-19 ENCOUNTER — Ambulatory Visit
Admission: RE | Admit: 2018-12-19 | Discharge: 2018-12-19 | Disposition: A | Discharge status: Home / Self Care | Payer: Medicare Other | Source: Ambulatory Visit | Attending: Radiation Oncology | Admitting: Radiation Oncology

## 2018-12-19 ENCOUNTER — Other Ambulatory Visit: Payer: Self-pay

## 2018-12-19 DIAGNOSIS — Z510 Encounter for antineoplastic radiation therapy: Secondary | ICD-10-CM | POA: Insufficient documentation

## 2018-12-19 DIAGNOSIS — C44329 Squamous cell carcinoma of skin of other parts of face: Secondary | ICD-10-CM | POA: Insufficient documentation

## 2018-12-26 ENCOUNTER — Other Ambulatory Visit: Payer: Self-pay

## 2018-12-26 ENCOUNTER — Ambulatory Visit
Admission: RE | Admit: 2018-12-26 | Discharge: 2018-12-26 | Disposition: A | Discharge status: Home / Self Care | Payer: Medicare Other | Source: Ambulatory Visit | Attending: Radiation Oncology | Admitting: Radiation Oncology

## 2018-12-26 DIAGNOSIS — Z510 Encounter for antineoplastic radiation therapy: Secondary | ICD-10-CM | POA: Diagnosis not present

## 2018-12-27 ENCOUNTER — Ambulatory Visit
Admission: RE | Admit: 2018-12-27 | Discharge: 2018-12-27 | Disposition: A | Discharge status: Home / Self Care | Payer: Medicare Other | Source: Ambulatory Visit | Attending: Radiation Oncology | Admitting: Radiation Oncology

## 2018-12-27 ENCOUNTER — Other Ambulatory Visit: Payer: Self-pay

## 2018-12-27 DIAGNOSIS — Z510 Encounter for antineoplastic radiation therapy: Secondary | ICD-10-CM | POA: Diagnosis not present

## 2018-12-28 ENCOUNTER — Other Ambulatory Visit: Payer: Self-pay

## 2018-12-28 ENCOUNTER — Ambulatory Visit
Admission: RE | Admit: 2018-12-28 | Discharge: 2018-12-28 | Disposition: A | Discharge status: Home / Self Care | Payer: Medicare Other | Source: Ambulatory Visit | Attending: Radiation Oncology | Admitting: Radiation Oncology

## 2018-12-28 DIAGNOSIS — Z510 Encounter for antineoplastic radiation therapy: Secondary | ICD-10-CM | POA: Diagnosis not present

## 2018-12-29 ENCOUNTER — Other Ambulatory Visit: Payer: Self-pay

## 2018-12-29 ENCOUNTER — Ambulatory Visit
Admission: RE | Admit: 2018-12-29 | Discharge: 2018-12-29 | Disposition: A | Discharge status: Home / Self Care | Payer: Medicare Other | Source: Ambulatory Visit | Attending: Radiation Oncology | Admitting: Radiation Oncology

## 2018-12-29 DIAGNOSIS — Z510 Encounter for antineoplastic radiation therapy: Secondary | ICD-10-CM | POA: Diagnosis not present

## 2019-01-01 ENCOUNTER — Ambulatory Visit
Admission: RE | Admit: 2019-01-01 | Discharge: 2019-01-01 | Disposition: A | Discharge status: Home / Self Care | Payer: Medicare Other | Source: Ambulatory Visit | Attending: Radiation Oncology | Admitting: Radiation Oncology

## 2019-01-01 ENCOUNTER — Other Ambulatory Visit: Payer: Self-pay

## 2019-01-01 DIAGNOSIS — Z510 Encounter for antineoplastic radiation therapy: Secondary | ICD-10-CM | POA: Diagnosis not present

## 2019-01-02 ENCOUNTER — Ambulatory Visit
Admission: RE | Admit: 2019-01-02 | Discharge: 2019-01-02 | Disposition: A | Discharge status: Home / Self Care | Payer: Medicare Other | Source: Ambulatory Visit | Attending: Radiation Oncology | Admitting: Radiation Oncology

## 2019-01-02 ENCOUNTER — Other Ambulatory Visit: Payer: Self-pay

## 2019-01-02 DIAGNOSIS — Z510 Encounter for antineoplastic radiation therapy: Secondary | ICD-10-CM | POA: Diagnosis not present

## 2019-01-03 ENCOUNTER — Ambulatory Visit
Admission: RE | Admit: 2019-01-03 | Discharge: 2019-01-03 | Disposition: A | Discharge status: Home / Self Care | Payer: Medicare Other | Source: Ambulatory Visit | Attending: Radiation Oncology | Admitting: Radiation Oncology

## 2019-01-03 ENCOUNTER — Other Ambulatory Visit: Payer: Self-pay

## 2019-01-03 DIAGNOSIS — Z510 Encounter for antineoplastic radiation therapy: Secondary | ICD-10-CM | POA: Diagnosis not present

## 2019-01-04 ENCOUNTER — Ambulatory Visit
Admission: RE | Admit: 2019-01-04 | Discharge: 2019-01-04 | Disposition: A | Discharge status: Home / Self Care | Payer: Medicare Other | Source: Ambulatory Visit | Attending: Radiation Oncology | Admitting: Radiation Oncology

## 2019-01-04 ENCOUNTER — Other Ambulatory Visit: Payer: Self-pay

## 2019-01-04 DIAGNOSIS — Z510 Encounter for antineoplastic radiation therapy: Secondary | ICD-10-CM | POA: Diagnosis not present

## 2019-01-05 ENCOUNTER — Ambulatory Visit
Admission: RE | Admit: 2019-01-05 | Discharge: 2019-01-05 | Disposition: A | Discharge status: Home / Self Care | Payer: Medicare Other | Source: Ambulatory Visit | Attending: Radiation Oncology | Admitting: Radiation Oncology

## 2019-01-05 ENCOUNTER — Other Ambulatory Visit: Payer: Self-pay

## 2019-01-05 DIAGNOSIS — Z510 Encounter for antineoplastic radiation therapy: Secondary | ICD-10-CM | POA: Diagnosis not present

## 2019-01-08 ENCOUNTER — Other Ambulatory Visit: Payer: Self-pay

## 2019-01-08 ENCOUNTER — Ambulatory Visit
Admission: RE | Admit: 2019-01-08 | Discharge: 2019-01-08 | Disposition: A | Discharge status: Home / Self Care | Payer: Medicare Other | Source: Ambulatory Visit | Attending: Radiation Oncology | Admitting: Radiation Oncology

## 2019-01-08 DIAGNOSIS — Z510 Encounter for antineoplastic radiation therapy: Secondary | ICD-10-CM | POA: Diagnosis not present

## 2019-01-09 ENCOUNTER — Ambulatory Visit
Admission: RE | Admit: 2019-01-09 | Discharge: 2019-01-09 | Disposition: A | Discharge status: Home / Self Care | Payer: Medicare Other | Source: Ambulatory Visit | Attending: Radiation Oncology | Admitting: Radiation Oncology

## 2019-01-09 ENCOUNTER — Other Ambulatory Visit: Payer: Self-pay

## 2019-01-09 DIAGNOSIS — Z510 Encounter for antineoplastic radiation therapy: Secondary | ICD-10-CM | POA: Diagnosis not present

## 2019-01-10 ENCOUNTER — Other Ambulatory Visit: Payer: Self-pay

## 2019-01-10 ENCOUNTER — Ambulatory Visit
Admission: RE | Admit: 2019-01-10 | Discharge: 2019-01-10 | Disposition: A | Discharge status: Home / Self Care | Payer: Medicare Other | Source: Ambulatory Visit | Attending: Radiation Oncology | Admitting: Radiation Oncology

## 2019-01-10 DIAGNOSIS — Z510 Encounter for antineoplastic radiation therapy: Secondary | ICD-10-CM | POA: Diagnosis not present

## 2019-01-11 ENCOUNTER — Other Ambulatory Visit: Payer: Self-pay

## 2019-01-11 ENCOUNTER — Ambulatory Visit
Admission: RE | Admit: 2019-01-11 | Discharge: 2019-01-11 | Disposition: A | Discharge status: Home / Self Care | Payer: Medicare Other | Source: Ambulatory Visit | Attending: Radiation Oncology | Admitting: Radiation Oncology

## 2019-01-11 DIAGNOSIS — Z510 Encounter for antineoplastic radiation therapy: Secondary | ICD-10-CM | POA: Diagnosis not present

## 2019-01-12 ENCOUNTER — Ambulatory Visit
Admission: RE | Admit: 2019-01-12 | Discharge: 2019-01-12 | Disposition: A | Discharge status: Home / Self Care | Payer: Medicare Other | Source: Ambulatory Visit | Attending: Radiation Oncology | Admitting: Radiation Oncology

## 2019-01-12 ENCOUNTER — Other Ambulatory Visit: Payer: Self-pay

## 2019-01-12 DIAGNOSIS — Z510 Encounter for antineoplastic radiation therapy: Secondary | ICD-10-CM | POA: Diagnosis not present

## 2019-01-15 ENCOUNTER — Other Ambulatory Visit: Payer: Self-pay

## 2019-01-15 ENCOUNTER — Ambulatory Visit
Admission: RE | Admit: 2019-01-15 | Discharge: 2019-01-15 | Disposition: A | Discharge status: Home / Self Care | Payer: Medicare Other | Source: Ambulatory Visit | Attending: Radiation Oncology | Admitting: Radiation Oncology

## 2019-01-15 DIAGNOSIS — Z510 Encounter for antineoplastic radiation therapy: Secondary | ICD-10-CM | POA: Diagnosis not present

## 2019-01-16 ENCOUNTER — Ambulatory Visit
Admission: RE | Admit: 2019-01-16 | Discharge: 2019-01-16 | Disposition: A | Discharge status: Home / Self Care | Payer: Medicare Other | Source: Ambulatory Visit | Attending: Radiation Oncology | Admitting: Radiation Oncology

## 2019-01-16 ENCOUNTER — Other Ambulatory Visit: Payer: Self-pay

## 2019-01-16 DIAGNOSIS — C44329 Squamous cell carcinoma of skin of other parts of face: Secondary | ICD-10-CM | POA: Diagnosis not present

## 2019-01-16 DIAGNOSIS — Z510 Encounter for antineoplastic radiation therapy: Secondary | ICD-10-CM | POA: Diagnosis not present

## 2019-01-17 ENCOUNTER — Ambulatory Visit
Admission: RE | Admit: 2019-01-17 | Discharge: 2019-01-17 | Disposition: A | Discharge status: Home / Self Care | Payer: Medicare Other | Source: Ambulatory Visit | Attending: Radiation Oncology | Admitting: Radiation Oncology

## 2019-01-17 ENCOUNTER — Other Ambulatory Visit: Payer: Self-pay

## 2019-01-17 DIAGNOSIS — Z510 Encounter for antineoplastic radiation therapy: Secondary | ICD-10-CM | POA: Diagnosis not present

## 2019-01-18 ENCOUNTER — Ambulatory Visit
Admission: RE | Admit: 2019-01-18 | Discharge: 2019-01-18 | Disposition: A | Discharge status: Home / Self Care | Payer: Medicare Other | Source: Ambulatory Visit | Attending: Radiation Oncology | Admitting: Radiation Oncology

## 2019-01-18 ENCOUNTER — Other Ambulatory Visit: Payer: Self-pay

## 2019-01-18 DIAGNOSIS — Z510 Encounter for antineoplastic radiation therapy: Secondary | ICD-10-CM | POA: Diagnosis not present

## 2019-01-19 ENCOUNTER — Other Ambulatory Visit: Payer: Self-pay

## 2019-01-19 ENCOUNTER — Ambulatory Visit
Admission: RE | Admit: 2019-01-19 | Discharge: 2019-01-19 | Disposition: A | Discharge status: Home / Self Care | Payer: Medicare Other | Source: Ambulatory Visit | Attending: Radiation Oncology | Admitting: Radiation Oncology

## 2019-01-19 DIAGNOSIS — Z510 Encounter for antineoplastic radiation therapy: Secondary | ICD-10-CM | POA: Diagnosis not present

## 2019-01-23 ENCOUNTER — Ambulatory Visit
Admission: RE | Admit: 2019-01-23 | Discharge: 2019-01-23 | Disposition: A | Discharge status: Home / Self Care | Payer: Medicare Other | Source: Ambulatory Visit | Attending: Radiation Oncology | Admitting: Radiation Oncology

## 2019-01-23 ENCOUNTER — Other Ambulatory Visit: Payer: Self-pay

## 2019-01-23 DIAGNOSIS — Z510 Encounter for antineoplastic radiation therapy: Secondary | ICD-10-CM | POA: Diagnosis not present

## 2019-01-24 ENCOUNTER — Other Ambulatory Visit: Payer: Self-pay

## 2019-01-24 ENCOUNTER — Ambulatory Visit
Admission: RE | Admit: 2019-01-24 | Discharge: 2019-01-24 | Disposition: A | Discharge status: Home / Self Care | Payer: Medicare Other | Source: Ambulatory Visit | Attending: Radiation Oncology | Admitting: Radiation Oncology

## 2019-01-24 DIAGNOSIS — Z510 Encounter for antineoplastic radiation therapy: Secondary | ICD-10-CM | POA: Diagnosis not present

## 2019-01-25 ENCOUNTER — Ambulatory Visit
Admission: RE | Admit: 2019-01-25 | Discharge: 2019-01-25 | Disposition: A | Discharge status: Home / Self Care | Payer: Medicare Other | Source: Ambulatory Visit | Attending: Radiation Oncology | Admitting: Radiation Oncology

## 2019-01-25 ENCOUNTER — Other Ambulatory Visit: Payer: Self-pay

## 2019-01-25 DIAGNOSIS — Z510 Encounter for antineoplastic radiation therapy: Secondary | ICD-10-CM | POA: Diagnosis not present

## 2019-01-26 ENCOUNTER — Other Ambulatory Visit: Payer: Self-pay

## 2019-01-26 ENCOUNTER — Ambulatory Visit
Admission: RE | Admit: 2019-01-26 | Discharge: 2019-01-26 | Disposition: A | Discharge status: Home / Self Care | Payer: Medicare Other | Source: Ambulatory Visit | Attending: Radiation Oncology | Admitting: Radiation Oncology

## 2019-01-26 DIAGNOSIS — Z510 Encounter for antineoplastic radiation therapy: Secondary | ICD-10-CM | POA: Diagnosis not present

## 2019-01-29 ENCOUNTER — Ambulatory Visit
Admission: RE | Admit: 2019-01-29 | Discharge: 2019-01-29 | Disposition: A | Discharge status: Home / Self Care | Payer: Medicare Other | Source: Ambulatory Visit | Attending: Radiation Oncology | Admitting: Radiation Oncology

## 2019-01-29 ENCOUNTER — Other Ambulatory Visit: Payer: Self-pay

## 2019-01-29 DIAGNOSIS — Z510 Encounter for antineoplastic radiation therapy: Secondary | ICD-10-CM | POA: Diagnosis not present

## 2019-01-30 ENCOUNTER — Ambulatory Visit
Admission: RE | Admit: 2019-01-30 | Discharge: 2019-01-30 | Disposition: A | Discharge status: Home / Self Care | Payer: Medicare Other | Source: Ambulatory Visit | Attending: Radiation Oncology | Admitting: Radiation Oncology

## 2019-01-30 ENCOUNTER — Other Ambulatory Visit: Payer: Self-pay

## 2019-01-30 DIAGNOSIS — Z510 Encounter for antineoplastic radiation therapy: Secondary | ICD-10-CM | POA: Diagnosis not present

## 2019-02-01 DIAGNOSIS — E441 Mild protein-calorie malnutrition: Secondary | ICD-10-CM

## 2019-02-01 DIAGNOSIS — G301 Alzheimer's disease with late onset: Secondary | ICD-10-CM

## 2019-02-01 DIAGNOSIS — I10 Essential (primary) hypertension: Secondary | ICD-10-CM

## 2019-02-01 DIAGNOSIS — M069 Rheumatoid arthritis, unspecified: Secondary | ICD-10-CM

## 2019-02-01 DIAGNOSIS — F39 Unspecified mood [affective] disorder: Secondary | ICD-10-CM

## 2019-02-16 DIAGNOSIS — K219 Gastro-esophageal reflux disease without esophagitis: Secondary | ICD-10-CM | POA: Diagnosis not present

## 2019-02-16 DIAGNOSIS — R110 Nausea: Secondary | ICD-10-CM

## 2019-02-19 DIAGNOSIS — J181 Lobar pneumonia, unspecified organism: Secondary | ICD-10-CM | POA: Diagnosis not present

## 2019-03-01 ENCOUNTER — Ambulatory Visit: Payer: Medicare Other | Admitting: Radiation Oncology

## 2019-03-18 DEATH — deceased
